# Patient Record
Sex: Female | Born: 1990 | Race: White | Hispanic: No | Marital: Married | Smoking: Never smoker
Health system: Southern US, Community
[De-identification: ages and names within clinical notes are randomized; demographics above are authoritative.]

## PROBLEM LIST (undated history)

## (undated) ENCOUNTER — Inpatient Hospital Stay (HOSPITAL_COMMUNITY): Payer: Self-pay

## (undated) DIAGNOSIS — O26899 Other specified pregnancy related conditions, unspecified trimester: Secondary | ICD-10-CM

## (undated) DIAGNOSIS — N760 Acute vaginitis: Secondary | ICD-10-CM

## (undated) DIAGNOSIS — N751 Abscess of Bartholin's gland: Secondary | ICD-10-CM

## (undated) DIAGNOSIS — R87619 Unspecified abnormal cytological findings in specimens from cervix uteri: Secondary | ICD-10-CM

## (undated) DIAGNOSIS — IMO0002 Reserved for concepts with insufficient information to code with codable children: Secondary | ICD-10-CM

## (undated) DIAGNOSIS — Z6791 Unspecified blood type, Rh negative: Secondary | ICD-10-CM

## (undated) DIAGNOSIS — B9689 Other specified bacterial agents as the cause of diseases classified elsewhere: Secondary | ICD-10-CM

## (undated) DIAGNOSIS — N390 Urinary tract infection, site not specified: Secondary | ICD-10-CM

## (undated) DIAGNOSIS — F419 Anxiety disorder, unspecified: Secondary | ICD-10-CM

## (undated) DIAGNOSIS — E349 Endocrine disorder, unspecified: Secondary | ICD-10-CM

## (undated) HISTORY — PX: WISDOM TOOTH EXTRACTION: SHX21

## (undated) HISTORY — DX: Urinary tract infection, site not specified: N39.0

## (undated) HISTORY — DX: Reserved for concepts with insufficient information to code with codable children: IMO0002

## (undated) HISTORY — DX: Other specified bacterial agents as the cause of diseases classified elsewhere: B96.89

## (undated) HISTORY — DX: Abscess of Bartholin's gland: N75.1

## (undated) HISTORY — DX: Acute vaginitis: B96.89

## (undated) HISTORY — DX: Unspecified blood type, rh negative: Z67.91

## (undated) HISTORY — DX: Endocrine disorder, unspecified: E34.9

## (undated) HISTORY — DX: Unspecified abnormal cytological findings in specimens from cervix uteri: R87.619

## (undated) HISTORY — DX: Anxiety disorder, unspecified: F41.9

## (undated) HISTORY — DX: Other specified pregnancy related conditions, unspecified trimester: O26.899

## (undated) HISTORY — PX: MOUTH SURGERY: SHX715

## (undated) HISTORY — DX: Other specified bacterial agents as the cause of diseases classified elsewhere: N76.0

---
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
## 2011-06-24 LAB — BASIC METABOLIC PANEL
CO2: 23
Calcium: 9.2
Chloride: 107
Creatinine, Ser: 0.79
GFR calc Af Amer: >60
Glucose, Bld: 114 — ABNORMAL HIGH
Sodium: 138

## 2011-06-24 LAB — URINALYSIS, ROUTINE W REFLEX MICROSCOPIC
Glucose, UA: NEGATIVE
Leukocytes, UA: NEGATIVE
Nitrite: NEGATIVE
pH: 5.5

## 2011-06-24 LAB — CBC
Hemoglobin: 14.5
MCHC: 33.7
MCV: 90.4
RBC: 4.76
RDW: 12.5

## 2011-06-24 LAB — URINE MICROSCOPIC-ADD ON

## 2011-06-24 LAB — DIFFERENTIAL
Basophils Absolute: 0
Basophils Relative: 0
Eosinophils Absolute: 0
Monocytes Absolute: 0.3
Monocytes Relative: 3
Neutro Abs: 9.6 — ABNORMAL HIGH

## 2011-09-18 ENCOUNTER — Ambulatory Visit (INDEPENDENT_AMBULATORY_CARE_PROVIDER_SITE_OTHER): Payer: BC Managed Care – PPO

## 2011-09-18 DIAGNOSIS — N390 Urinary tract infection, site not specified: Secondary | ICD-10-CM

## 2011-09-23 NOTE — L&D Delivery Note (Signed)
Delivery Note At 7:03 PM a viable female was delivered via Vaginal, Spontaneous Delivery (Presentation: ; Occiput Anterior).  APGAR: 9, 10; weight .   Placenta status:  Spontaneous.  Cord: 3 vessels with the following complications: None.  Cord pH: not done  Anesthesia: Epidural  Episiotomy: None Lacerations: 1st degree;Labial;Perineal Suture Repair: 3.0 vicryl Est. Blood Loss (mL)  Mom to postpartum.  Baby to nursery-stable.  Quinesha Selinger, CNM. 05/26/2012, 7:36 PM

## 2011-11-17 ENCOUNTER — Ambulatory Visit (INDEPENDENT_AMBULATORY_CARE_PROVIDER_SITE_OTHER): Payer: BC Managed Care – PPO | Admitting: Family Medicine

## 2011-11-17 VITALS — BP 122/86 | HR 123 | Temp 97.7°F | Resp 16 | Ht 66.0 in | Wt 181.8 lb

## 2011-11-17 DIAGNOSIS — N390 Urinary tract infection, site not specified: Secondary | ICD-10-CM

## 2011-11-17 DIAGNOSIS — R300 Dysuria: Secondary | ICD-10-CM

## 2011-11-17 DIAGNOSIS — N898 Other specified noninflammatory disorders of vagina: Secondary | ICD-10-CM

## 2011-11-17 LAB — POCT UA - MICROSCOPIC ONLY
Casts, Ur, LPF, POC: NEGATIVE
Crystals, Ur, HPF, POC: NEGATIVE
Yeast, UA: NEGATIVE

## 2011-11-17 LAB — POCT URINALYSIS DIPSTICK
Glucose, UA: NEGATIVE
Nitrite, UA: POSITIVE
Protein, UA: 100
Spec Grav, UA: 1.02
Urobilinogen, UA: 2
pH, UA: 7

## 2011-11-17 LAB — POCT WET PREP WITH KOH
KOH Prep POC: NEGATIVE
Trichomonas, UA: NEGATIVE

## 2011-11-17 MED ORDER — METRONIDAZOLE 500 MG PO TABS: 500.0000 mg | ORAL_TABLET | Freq: Two times a day (BID) | ORAL | Status: AC

## 2011-11-17 MED ORDER — NITROFURANTOIN MONOHYD MACRO 100 MG PO CAPS: 100.0000 mg | ORAL_CAPSULE | Freq: Two times a day (BID) | ORAL | Status: AC

## 2011-11-17 NOTE — Progress Notes (Signed)
Patient Name: Brandy Patterson Date of Birth: 09/27/1990 Medical Record Number: [id number] Gender: female Date of Encounter: 11/17/2011  History of Present Illness:  Brandy Patterson is a 21 y.o. very pleasant female patient who presents with the following:  Currently 3 months pregnant.  Awoke with UTI symptoms last night around 1am.  Took azo after she looked it up online to make sure it was safe for pregnancy.  Notes dysuria and frequency.  No fever, nausea (pregnancy associated) is getting better.  EDC is 06/07/12: this would make her 11 weeks today.  Did have a dating ultrasound. She has had #2 miscarriages.  Is applying for emergency medicaid because her insurance does not cover pregnancy.  So far she has had limited OB care but does plan to start more regular care.  Is on PNV Also notes a rash on her chest and belly for the last couple of weeks- is itchy.   Also notes some clear/ white vaginal discharge which has been present since she became pregnant.  No itching.    There is no problem list on file for this patient.  Past Medical History  Diagnosis Date  . UTI (lower urinary tract infection)   . Bacterial vaginosis    Past Surgical History  Procedure Date  . Hydradenitis excision 2010    groin  . Mouth surgery    History  Substance Use Topics  . Smoking status: Never Smoker   . Smokeless tobacco: Never Used  . Alcohol Use: No   Family History  Problem Relation Age of Onset  . Diabetes Father   . Hypertension Father    Allergies  Allergen Reactions  . Doxycycline Hives  . Erythromycin Hives  . Keflex Hives    Medication list has been reviewed and updated.  Review of Systems: As per HPI. Otherwise negative  Physical Examination: Filed Vitals:   11/17/11 0827  BP: 122/86  Pulse: 123  Temp: 97.7 F (36.5 C)  TempSrc: Oral  Resp: 16  Height: 5\' 6"  (1.676 m)  Weight: 181 lb 12.8 oz (82.464 kg)  recheck pulse by me: 90 BPM  Body mass index is 29.34  kg/(m^2).  GEN: WDWN, NAD, Non-toxic, A & O x 3, overweight HEENT: Atraumatic, Normocephalic. Neck supple. No masses, No LAD. Ears and Nose: No external deformity. CV: RRR, No M/G/R. No JVD. No thrill. No extra heart sounds. PULM: CTA B, no wheezes, crackles, rhonchi. No retractions. No resp. distress. No accessory muscle use. ABD: S, NT, ND, +BS. No rebound. No HSM. GU: normal external genitalia, evidence of hydradenitis excision in the past.  No cervical motion tenderness or cervical discharge.  Uterus slighly enlarged (consistent with dates), adnexa wnl.   Skin: discrete excoriated macules over her trunk and breasts EXTR: No c/c/e NEURO Normal gait.  PSYCH: Normally interactive. Conversant. Not depressed or anxious appearing.  Calm demeanor.   Results for orders placed in visit on 11/17/11  POCT URINALYSIS DIPSTICK      Component Value Range   Color, UA orange     Clarity, UA cloudy     Glucose, UA negative     Bilirubin, UA small     Ketones, UA trace     Spec Grav, UA 1.020     Blood, UA trace     pH, UA 7.0     Protein, UA 100     Urobilinogen, UA 2.0     Nitrite, UA positive     Leukocytes, UA large (3+)  POCT UA - MICROSCOPIC ONLY      Component Value Range   WBC, Ur, HPF, POC tntc     RBC, urine, microscopic 2-4     Bacteria, U Microscopic small     Mucus, UA small     Epithelial cells, urine per micros 2-4     Crystals, Ur, HPF, POC negative     Casts, Ur, LPF, POC negative     Yeast, UA negative    POCT WET PREP WITH KOH      Component Value Range   Trichomonas, UA Negative     Clue Cells Wet Prep HPF POC 2-4     Epithelial Wet Prep HPF POC 1-3     Yeast Wet Prep HPF POC negative     Bacteria Wet Prep HPF POC 1+     RBC Wet Prep HPF POC negative     WBC Wet Prep HPF POC 5-8     KOH Prep POC Negative     Assessment and Plan: 1. Dysuria  POCT urinalysis dipstick, POCT UA - Microscopic Only, Urine culture  2. Vaginal Discharge  POCT Wet Prep with KOH  3.  UTI (lower urinary tract infection)     Treat for BV with flagyl 500 BID for 7 days, and UTI with macrobid.  Urine culture pending.  Asked her to come back in about a week to have a urine recheck, sooner if her symptoms are not better.

## 2011-11-19 LAB — URINE CULTURE: Colony Count: >=100000

## 2011-11-19 MED ORDER — SULFAMETHOXAZOLE-TRIMETHOPRIM 800-160 MG PO TABS: 1.0000 | ORAL_TABLET | Freq: Two times a day (BID) | ORAL | Status: AC

## 2011-11-19 MED ORDER — AMOXICILLIN 500 MG PO CAPS: 500.0000 mg | ORAL_CAPSULE | Freq: Two times a day (BID) | ORAL | Status: AC

## 2011-11-19 NOTE — Progress Notes (Signed)
Addended by: Abbe Amsterdam C on: 11/19/2011 06:03 PM   Modules accepted: Orders

## 2011-11-19 NOTE — Progress Notes (Signed)
Addended by: Abbe Amsterdam C on: 11/19/2011 02:21 PM   Modules accepted: Orders

## 2011-12-08 ENCOUNTER — Encounter (INDEPENDENT_AMBULATORY_CARE_PROVIDER_SITE_OTHER): Payer: BC Managed Care – PPO

## 2011-12-08 DIAGNOSIS — Z331 Pregnant state, incidental: Secondary | ICD-10-CM

## 2011-12-08 LAB — OB RESULTS CONSOLE HEPATITIS B SURFACE ANTIGEN: Hepatitis B Surface Ag: NEGATIVE

## 2011-12-08 LAB — OB RESULTS CONSOLE HGB/HCT, BLOOD: Hemoglobin: 12.9 g/dL

## 2011-12-08 LAB — OB RESULTS CONSOLE ABO/RH: RH Type: NEGATIVE

## 2011-12-10 ENCOUNTER — Encounter (INDEPENDENT_AMBULATORY_CARE_PROVIDER_SITE_OTHER): Payer: BC Managed Care – PPO | Admitting: Registered Nurse

## 2011-12-10 DIAGNOSIS — Z3480 Encounter for supervision of other normal pregnancy, unspecified trimester: Secondary | ICD-10-CM

## 2012-01-07 ENCOUNTER — Other Ambulatory Visit: Payer: BC Managed Care – PPO

## 2012-01-07 ENCOUNTER — Ambulatory Visit (INDEPENDENT_AMBULATORY_CARE_PROVIDER_SITE_OTHER): Payer: BC Managed Care – PPO | Admitting: Obstetrics and Gynecology

## 2012-01-07 ENCOUNTER — Ambulatory Visit: Payer: BC Managed Care – PPO

## 2012-01-07 ENCOUNTER — Other Ambulatory Visit: Payer: Self-pay | Admitting: Obstetrics and Gynecology

## 2012-01-07 ENCOUNTER — Encounter: Payer: Self-pay | Admitting: Obstetrics and Gynecology

## 2012-01-07 VITALS — BP 104/60 | Ht 65.0 in | Wt 199.0 lb

## 2012-01-07 DIAGNOSIS — Z1389 Encounter for screening for other disorder: Secondary | ICD-10-CM

## 2012-01-07 DIAGNOSIS — Z3689 Encounter for other specified antenatal screening: Secondary | ICD-10-CM

## 2012-01-07 DIAGNOSIS — Z331 Pregnant state, incidental: Secondary | ICD-10-CM

## 2012-01-07 LAB — US OB COMP + 14 WK

## 2012-01-07 NOTE — Progress Notes (Signed)
Doing well,  Anat [place] today S=D 54% Cx=3.16cm Anterior placenta grade 0 Normal fluid No anomalies seen Incomplete views of: profile, NB, lower spine and AA  Will repeat scan in 4wks  rv'd PTL sx's Discussed CBE Pt reports less anxiety now than beginning of preg

## 2012-02-03 ENCOUNTER — Encounter: Payer: Self-pay | Admitting: Obstetrics and Gynecology

## 2012-02-04 ENCOUNTER — Ambulatory Visit: Payer: BC Managed Care – PPO

## 2012-02-04 ENCOUNTER — Encounter: Payer: Self-pay | Admitting: Obstetrics and Gynecology

## 2012-02-04 ENCOUNTER — Ambulatory Visit (INDEPENDENT_AMBULATORY_CARE_PROVIDER_SITE_OTHER): Payer: BC Managed Care – PPO | Admitting: Obstetrics and Gynecology

## 2012-02-04 VITALS — BP 100/60 | Wt 208.0 lb

## 2012-02-04 DIAGNOSIS — O358XX0 Maternal care for other (suspected) fetal abnormality and damage, not applicable or unspecified: Secondary | ICD-10-CM

## 2012-02-04 DIAGNOSIS — Z331 Pregnant state, incidental: Secondary | ICD-10-CM

## 2012-02-04 LAB — US OB FOLLOW UP

## 2012-02-04 NOTE — Progress Notes (Signed)
Doing well. Here for f/u US for anatomy--all structures visualized. Normal fluid and growth.  Cervix 3.79.  Plan glucola, Hgb, RPR NV. Rhophylac at 28-30 weeks--reviewed information on Rhophylac rationale.

## 2012-03-02 ENCOUNTER — Telehealth: Payer: Self-pay | Admitting: Obstetrics and Gynecology

## 2012-03-02 NOTE — Telephone Encounter (Signed)
Triage/epic 

## 2012-03-03 ENCOUNTER — Other Ambulatory Visit: Payer: Medicaid Other

## 2012-03-03 ENCOUNTER — Ambulatory Visit (INDEPENDENT_AMBULATORY_CARE_PROVIDER_SITE_OTHER): Payer: BC Managed Care – PPO | Admitting: Obstetrics and Gynecology

## 2012-03-03 VITALS — BP 104/64 | Wt 216.0 lb

## 2012-03-03 DIAGNOSIS — Z331 Pregnant state, incidental: Secondary | ICD-10-CM

## 2012-03-03 DIAGNOSIS — Z3490 Encounter for supervision of normal pregnancy, unspecified, unspecified trimester: Secondary | ICD-10-CM

## 2012-03-03 LAB — CBC
MCH: 31 pg (ref 26.0–34.0)
MCV: 92 fL (ref 78.0–100.0)
Platelets: 162 10*3/uL (ref 150–400)
RDW: 13 % (ref 11.5–15.5)
WBC: 9.6 10*3/uL (ref 4.0–10.5)

## 2012-03-03 NOTE — Telephone Encounter (Signed)
PT CALLED, STATES HAS AN APPT FOR 1 HR GLUCOLA TODAY, ?'S IF SHE NEEDS TO FAST.  PT ADVISED TO EAT SOMETHING WITH PROTEIN BEFORE HAVING TEST DONE.  EXPLAINED TO PT THE SIGNIFICANCE OF EATING BEFORE THIS TYPE OF TEST, PT VOICES UNDERSTANDING, WILL EAT PRIOR TO TEST.

## 2012-03-03 NOTE — Progress Notes (Signed)
Glucola today Discussed wt Rhogam at 28 wks Doing well otherwise RTO 2 wks

## 2012-03-04 LAB — GLUCOSE TOLERANCE, 1 HOUR: Glucose, 1 Hour GTT: 103 mg/dL (ref 70–140)

## 2012-03-17 ENCOUNTER — Ambulatory Visit (INDEPENDENT_AMBULATORY_CARE_PROVIDER_SITE_OTHER): Payer: Medicaid Other | Admitting: Obstetrics and Gynecology

## 2012-03-17 ENCOUNTER — Encounter: Payer: Self-pay | Admitting: Obstetrics and Gynecology

## 2012-03-17 ENCOUNTER — Inpatient Hospital Stay (HOSPITAL_COMMUNITY)
Admission: AD | Admit: 2012-03-17 | Discharge: 2012-03-17 | Disposition: A | Discharge status: Home / Self Care | Payer: Medicaid Other | Source: Ambulatory Visit | Attending: Obstetrics and Gynecology | Admitting: Obstetrics and Gynecology

## 2012-03-17 ENCOUNTER — Encounter: Payer: Medicaid Other | Admitting: Obstetrics and Gynecology

## 2012-03-17 VITALS — BP 118/68 | Wt 222.0 lb

## 2012-03-17 DIAGNOSIS — F419 Anxiety disorder, unspecified: Secondary | ICD-10-CM

## 2012-03-17 DIAGNOSIS — Z9149 Other personal history of psychological trauma, not elsewhere classified: Secondary | ICD-10-CM

## 2012-03-17 DIAGNOSIS — Z2989 Encounter for other specified prophylactic measures: Secondary | ICD-10-CM | POA: Insufficient documentation

## 2012-03-17 DIAGNOSIS — F411 Generalized anxiety disorder: Secondary | ICD-10-CM

## 2012-03-17 DIAGNOSIS — Z331 Pregnant state, incidental: Secondary | ICD-10-CM

## 2012-03-17 DIAGNOSIS — Z298 Encounter for other specified prophylactic measures: Secondary | ICD-10-CM | POA: Insufficient documentation

## 2012-03-17 DIAGNOSIS — Z6791 Unspecified blood type, Rh negative: Secondary | ICD-10-CM | POA: Insufficient documentation

## 2012-03-17 DIAGNOSIS — O360990 Maternal care for other rhesus isoimmunization, unspecified trimester, not applicable or unspecified: Secondary | ICD-10-CM

## 2012-03-17 DIAGNOSIS — IMO0002 Reserved for concepts with insufficient information to code with codable children: Secondary | ICD-10-CM

## 2012-03-17 LAB — ABO/RH: ABO/RH(D): A NEG

## 2012-03-17 MED ORDER — RHO D IMMUNE GLOBULIN 1500 UNIT/2ML IJ SOLN
300.0000 ug | Freq: Once | INTRAMUSCULAR | Status: AC
  Administered 2012-03-17: 300 ug via INTRAMUSCULAR
  Filled 2012-03-17: qty 2

## 2012-03-17 NOTE — Progress Notes (Signed)
[redacted]w[redacted]d Going to get rhogam today rec CBE,  And choosing peds, took BF  rvd 1hr gtt =103 rv'd PTL and FKC

## 2012-03-17 NOTE — Patient Instructions (Signed)
Preventing Preterm Labor Preterm labor is when a pregnant woman has contractions that cause the cervix to open, shorten, and thin before 37 weeks of pregnancy. You will have regular contractions (tightening) 2 to 3 minutes apart. This usually causes discomfort or pain. HOME CARE  Eat a healthy diet.   Take your vitamins as told by your doctor.   Drink enough fluids to keep your pee (urine) clear or pale yellow every day.   Get rest and sleep.   Do not have sex if you are at high risk for preterm labor.   Follow your doctor's advice about activity, medicines, and tests.   Avoid stress.   Avoid hard labor or exercise that lasts for a long time.   Do not smoke.  GET HELP RIGHT AWAY IF:   You are having contractions.   You have belly (abdominal) pain.   You have bleeding from your vagina.   You have pain when you pee (urinate).   You have abnormal discharge from your vagina.   You have a temperature by mouth above 102 F (38.9 C).  MAKE SURE YOU:  Understand these instructions.   Will watch your condition.   Will get help if you are not doing well or get worse.  Document Released: 12/05/2008 Document Revised: 08/28/2011 Document Reviewed: 12/05/2008 [employer] Patient Information 2012 [employer].Fetal Movement Counts Patient Name: __________________________________________________ Patient Due Date: ____________________ Kick counts is highly recommended in high risk pregnancies, but it is a good idea for every pregnant woman to do. Start counting fetal movements at 28 weeks of the pregnancy. Fetal movements increase after eating a full meal or eating or drinking something sweet (the blood sugar is higher). It is also important to drink plenty of fluids (well hydrated) before doing the count. Lie on your left side because it helps with the circulation or you can sit in a comfortable chair with your arms over your belly (abdomen) with no distractions around you. DOING THE  COUNT  Try to do the count the same time of day each time you do it.   Mark the day and time, then see how long it takes for you to feel 10 movements (kicks, flutters, swishes, rolls). You should have at least 10 movements within 2 hours. You will most likely feel 10 movements in much less than 2 hours. If you do not, wait an hour and count again. After a couple of days you will see a pattern.   What you are looking for is a change in the pattern or not enough counts in 2 hours. Is it taking longer in time to reach 10 movements?  SEEK MEDICAL CARE IF:  You feel less than 10 counts in 2 hours. Tried twice.   No movement in one hour.   The pattern is changing or taking longer each day to reach 10 counts in 2 hours.   You feel the baby is not moving as it usually does.  Date: ____________ Movements: ____________ Start time: ____________ Finish time: ____________  Date: ____________ Movements: ____________ Start time: ____________ Finish time: ____________ Date: ____________ Movements: ____________ Start time: ____________ Finish time: ____________ Date: ____________ Movements: ____________ Start time: ____________ Finish time: ____________ Date: ____________ Movements: ____________ Start time: ____________ Finish time: ____________ Date: ____________ Movements: ____________ Start time: ____________ Finish time: ____________ Date: ____________ Movements: ____________ Start time: ____________ Finish time: ____________ Date: ____________ Movements: ____________ Start time: ____________ Finish time: ____________  Date: ____________ Movements: ____________ Start time: ____________ Finish time:   ____________ Date: ____________ Movements: ____________ Start time: ____________ Finish time: ____________ Date: ____________ Movements: ____________ Start time: ____________ Finish time: ____________ Date: ____________ Movements: ____________ Start time: ____________ Finish time: ____________ Date:  ____________ Movements: ____________ Start time: ____________ Finish time: ____________ Date: ____________ Movements: ____________ Start time: ____________ Finish time: ____________ Date: ____________ Movements: ____________ Start time: ____________ Finish time: ____________  Date: ____________ Movements: ____________ Start time: ____________ Finish time: ____________ Date: ____________ Movements: ____________ Start time: ____________ Finish time: ____________ Date: ____________ Movements: ____________ Start time: ____________ Finish time: ____________ Date: ____________ Movements: ____________ Start time: ____________ Finish time: ____________ Date: ____________ Movements: ____________ Start time: ____________ Finish time: ____________ Date: ____________ Movements: ____________ Start time: ____________ Finish time: ____________ Date: ____________ Movements: ____________ Start time: ____________ Finish time: ____________  Date: ____________ Movements: ____________ Start time: ____________ Finish time: ____________ Date: ____________ Movements: ____________ Start time: ____________ Finish time: ____________ Date: ____________ Movements: ____________ Start time: ____________ Finish time: ____________ Date: ____________ Movements: ____________ Start time: ____________ Finish time: ____________ Date: ____________ Movements: ____________ Start time: ____________ Finish time: ____________ Date: ____________ Movements: ____________ Start time: ____________ Finish time: ____________ Date: ____________ Movements: ____________ Start time: ____________ Finish time: ____________  Date: ____________ Movements: ____________ Start time: ____________ Finish time: ____________ Date: ____________ Movements: ____________ Start time: ____________ Finish time: ____________ Date: ____________ Movements: ____________ Start time: ____________ Finish time: ____________ Date: ____________ Movements: ____________ Start  time: ____________ Finish time: ____________ Date: ____________ Movements: ____________ Start time: ____________ Finish time: ____________ Date: ____________ Movements: ____________ Start time: ____________ Finish time: ____________ Date: ____________ Movements: ____________ Start time: ____________ Finish time: ____________  Date: ____________ Movements: ____________ Start time: ____________ Finish time: ____________ Date: ____________ Movements: ____________ Start time: ____________ Finish time: ____________ Date: ____________ Movements: ____________ Start time: ____________ Finish time: ____________ Date: ____________ Movements: ____________ Start time: ____________ Finish time: ____________ Date: ____________ Movements: ____________ Start time: ____________ Finish time: ____________ Date: ____________ Movements: ____________ Start time: ____________ Finish time: ____________ Date: ____________ Movements: ____________ Start time: ____________ Finish time: ____________  Date: ____________ Movements: ____________ Start time: ____________ Finish time: ____________ Date: ____________ Movements: ____________ Start time: ____________ Finish time: ____________ Date: ____________ Movements: ____________ Start time: ____________ Finish time: ____________ Date: ____________ Movements: ____________ Start time: ____________ Finish time: ____________ Date: ____________ Movements: ____________ Start time: ____________ Finish time: ____________ Date: ____________ Movements: ____________ Start time: ____________ Finish time: ____________ Date: ____________ Movements: ____________ Start time: ____________ Finish time: ____________  Date: ____________ Movements: ____________ Start time: ____________ Finish time: ____________ Date: ____________ Movements: ____________ Start time: ____________ Finish time: ____________ Date: ____________ Movements: ____________ Start time: ____________ Finish time:  ____________ Date: ____________ Movements: ____________ Start time: ____________ Finish time: ____________ Date: ____________ Movements: ____________ Start time: ____________ Finish time: ____________ Date: ____________ Movements: ____________ Start time: ____________ Finish time: ____________ Document Released: 10/08/2006 Document Revised: 08/28/2011 Document Reviewed: 04/10/2009 [employer] Patient Information 2012 [employer]. 

## 2012-03-17 NOTE — Progress Notes (Signed)
No complaints

## 2012-03-17 NOTE — MAU Note (Addendum)
Received rhophyllac previously, no problems.  Time associated with blood draw and injection explained. Hand out given.

## 2012-03-18 LAB — RH IG WORKUP (INCLUDES ABO/RH)
Gestational Age(Wks): 28
Unit division: 0

## 2012-04-01 ENCOUNTER — Encounter: Payer: Self-pay | Admitting: Obstetrics and Gynecology

## 2012-04-01 ENCOUNTER — Ambulatory Visit (INDEPENDENT_AMBULATORY_CARE_PROVIDER_SITE_OTHER): Payer: Medicaid Other | Admitting: Obstetrics and Gynecology

## 2012-04-01 VITALS — BP 100/68 | Wt 222.0 lb

## 2012-04-01 DIAGNOSIS — Z331 Pregnant state, incidental: Secondary | ICD-10-CM

## 2012-04-01 NOTE — Progress Notes (Signed)
Patient ID: ABBIE BERLING, female   DOB: 09-Jul-1991, 21 y.o.   MRN: [id number] [redacted]w[redacted]d had rhophlac, 1 gtt wnl Reviewed s/s preterm labor, srom, vag bleeding,daily kick counts to report, encouraged 8 water daily and frequent voids. Lavera Guise, CNM

## 2012-04-01 NOTE — Progress Notes (Signed)
Received rhogam shot on 03/17/12

## 2012-04-14 ENCOUNTER — Telehealth: Payer: Self-pay

## 2012-04-14 MED ORDER — CIPROFLOXACIN HCL 500 MG PO TABS: 500.0000 mg | ORAL_TABLET | Freq: Two times a day (BID) | ORAL | Status: DC

## 2012-04-14 NOTE — Telephone Encounter (Signed)
NCQA SCREEN SHOT

## 2012-04-15 ENCOUNTER — Encounter: Payer: Self-pay | Admitting: Obstetrics and Gynecology

## 2012-04-15 ENCOUNTER — Ambulatory Visit (INDEPENDENT_AMBULATORY_CARE_PROVIDER_SITE_OTHER): Payer: Medicaid Other | Admitting: Obstetrics and Gynecology

## 2012-04-15 ENCOUNTER — Encounter: Payer: Medicaid Other | Admitting: Obstetrics and Gynecology

## 2012-04-15 VITALS — BP 112/64 | Wt 229.0 lb

## 2012-04-15 DIAGNOSIS — N949 Unspecified condition associated with female genital organs and menstrual cycle: Secondary | ICD-10-CM

## 2012-04-15 DIAGNOSIS — Z331 Pregnant state, incidental: Secondary | ICD-10-CM

## 2012-04-15 DIAGNOSIS — N898 Other specified noninflammatory disorders of vagina: Secondary | ICD-10-CM

## 2012-04-15 LAB — POCT WET PREP (WET MOUNT)
KOH Wet Prep POC: NEGATIVE
Trichomonas Wet Prep HPF POC: NEGATIVE
pH: 4.5

## 2012-04-15 MED ORDER — METRONIDAZOLE 500 MG PO TABS: 500.0000 mg | ORAL_TABLET | Freq: Two times a day (BID) | ORAL | Status: AC

## 2012-04-15 NOTE — Progress Notes (Signed)
C/o yellow d/c. C/o a lot of swelling in feet and ankle

## 2012-04-15 NOTE — Progress Notes (Signed)
Complains of vaginal odor. Wet prep: negative Patient wants metronidazole 500 mg twice a day for 7 days because of the long history of vaginosis. GC and Chlamydia sent. Return to office in 2 weeks. Dr. Stefano Gaul

## 2012-04-16 LAB — GC/CHLAMYDIA PROBE AMP, GENITAL
Chlamydia, DNA Probe: NEGATIVE
GC Probe Amp, Genital: NEGATIVE

## 2012-04-29 ENCOUNTER — Ambulatory Visit (INDEPENDENT_AMBULATORY_CARE_PROVIDER_SITE_OTHER): Payer: BC Managed Care – PPO | Admitting: Obstetrics and Gynecology

## 2012-04-29 ENCOUNTER — Encounter: Payer: Self-pay | Admitting: Obstetrics and Gynecology

## 2012-04-29 VITALS — BP 110/60 | Wt 230.0 lb

## 2012-04-29 DIAGNOSIS — O26849 Uterine size-date discrepancy, unspecified trimester: Secondary | ICD-10-CM | POA: Insufficient documentation

## 2012-04-29 DIAGNOSIS — O4190X0 Disorder of amniotic fluid and membranes, unspecified, unspecified trimester, not applicable or unspecified: Secondary | ICD-10-CM

## 2012-04-29 DIAGNOSIS — O288 Other abnormal findings on antenatal screening of mother: Secondary | ICD-10-CM

## 2012-04-29 NOTE — Progress Notes (Signed)
No concerns. 

## 2012-04-29 NOTE — Progress Notes (Signed)
Doing OK, but feeling the discomforts of 3rd trimester. GC, chlamydia negative from LV. S>D--plan [place] at NV. GBS NV.

## 2012-04-29 NOTE — Addendum Note (Signed)
Addended by: Janeece Agee on: 04/29/2012 01:14 PM   Modules accepted: Orders

## 2012-05-04 ENCOUNTER — Encounter (HOSPITAL_COMMUNITY): Payer: Self-pay | Admitting: *Deleted

## 2012-05-04 ENCOUNTER — Inpatient Hospital Stay (HOSPITAL_COMMUNITY)
Admission: AD | Admit: 2012-05-04 | Discharge: 2012-05-04 | Disposition: A | Discharge status: Home / Self Care | Payer: BC Managed Care – PPO | Source: Ambulatory Visit | Attending: Obstetrics and Gynecology | Admitting: Obstetrics and Gynecology

## 2012-05-04 DIAGNOSIS — O4700 False labor before 37 completed weeks of gestation, unspecified trimester: Secondary | ICD-10-CM

## 2012-05-04 DIAGNOSIS — Z331 Pregnant state, incidental: Secondary | ICD-10-CM

## 2012-05-04 DIAGNOSIS — O99891 Other specified diseases and conditions complicating pregnancy: Secondary | ICD-10-CM | POA: Insufficient documentation

## 2012-05-04 DIAGNOSIS — Z6791 Unspecified blood type, Rh negative: Secondary | ICD-10-CM

## 2012-05-04 DIAGNOSIS — O26849 Uterine size-date discrepancy, unspecified trimester: Secondary | ICD-10-CM

## 2012-05-04 DIAGNOSIS — IMO0002 Reserved for concepts with insufficient information to code with codable children: Secondary | ICD-10-CM

## 2012-05-04 DIAGNOSIS — F419 Anxiety disorder, unspecified: Secondary | ICD-10-CM

## 2012-05-04 DIAGNOSIS — N949 Unspecified condition associated with female genital organs and menstrual cycle: Secondary | ICD-10-CM | POA: Insufficient documentation

## 2012-05-04 NOTE — MAU Note (Signed)
Patient states that for the past 2 days has been wearing a panty liner that gets wet. States there is a sweet smell and is concerned about leaking fluid. Denies any pain and reports fetal movement, but not as much as usual.

## 2012-05-04 NOTE — MAU Note (Signed)
Pt states she first felt leaking of fluid yesterday morning. Pt states she has been leaking all day

## 2012-05-05 NOTE — MAU Provider Note (Signed)
  History     CSN: [id number]  Arrival date and time: 05/04/12 1903   None     Chief Complaint  Patient presents with  . Vaginal Discharge   HPI Comments: Pt is a G3P0 at [redacted]w[redacted]d arrives unannounced w c/o ? LOF, states she just has felt more d/c the last couple of days and her family was worried and insisted she come for eval. She denies gush of fluid, has used a panty liner, denies any VB, no ctx, reports GFM   Vaginal Discharge The patient's primary symptoms include a vaginal discharge.      Past Medical History  Diagnosis Date  . UTI (lower urinary tract infection)   . Bacterial vaginosis   . Rh negative status during pregnancy   . Anxiety   . Abnormal Pap smear   . Endocrinopathy     Past Surgical History  Procedure Date  . Hydradenitis excision 2010    groin  . Mouth surgery   . Wisdom tooth extraction     Family History  Problem Relation Age of Onset  . Diabetes Father   . Hypertension Father   . Hypertension Maternal Grandmother   . Diabetes Maternal Grandmother   . Hypertension Maternal Grandfather   . Diabetes Maternal Grandfather   . Cancer Maternal Grandfather   . Anemia Neg Hx   . Migraines Mother   . Migraines Sister     History  Substance Use Topics  . Smoking status: Never Smoker   . Smokeless tobacco: Never Used  . Alcohol Use: No    Allergies:  Allergies  Allergen Reactions  . Cephalexin Hives  . Doxycycline Hives  . Erythromycin Hives    No prescriptions prior to admission    Review of Systems  Genitourinary: Positive for vaginal discharge.  All other systems reviewed and are negative.   Physical Exam   Blood pressure 113/77, pulse 105, temperature 97.6 F (36.4 C), temperature source Oral, resp. rate 18, height 5' 4.5" (1.638 m), weight 233 lb 3.2 oz (105.779 kg), last menstrual period 08/30/2011, SpO2 100.00%.  Physical Exam  Nursing note and vitals reviewed. Constitutional: She is oriented to person, place, and  time. She appears well-developed and well-nourished.  HENT:  Head: Normocephalic.  Neck: Normal range of motion.  Cardiovascular: Normal rate.   Respiratory: Effort normal.  GI: Soft.  Genitourinary: Vagina normal.  Musculoskeletal: Normal range of motion. She exhibits edema.  Neurological: She is alert and oriented to person, place, and time.  Skin: Skin is warm and dry.  Psychiatric: She has a normal mood and affect. Her behavior is normal.    MAU Course  Procedures    Assessment and Plan  IUP at [redacted]w[redacted]d FHR cat 1 toco quiet amnisure neg VE deferred  dc'd home w PTL sx's and PROM/vs. Norm d/c rv'd, FKC Pt has f/u office Thurs   Laruen Risser M 05/05/2012, 3:49 AM

## 2012-05-10 ENCOUNTER — Ambulatory Visit (INDEPENDENT_AMBULATORY_CARE_PROVIDER_SITE_OTHER): Payer: BC Managed Care – PPO

## 2012-05-10 ENCOUNTER — Encounter: Payer: Self-pay | Admitting: Obstetrics and Gynecology

## 2012-05-10 ENCOUNTER — Ambulatory Visit (INDEPENDENT_AMBULATORY_CARE_PROVIDER_SITE_OTHER): Payer: BC Managed Care – PPO | Admitting: Obstetrics and Gynecology

## 2012-05-10 VITALS — BP 98/70 | Wt 235.0 lb

## 2012-05-10 DIAGNOSIS — O26849 Uterine size-date discrepancy, unspecified trimester: Secondary | ICD-10-CM

## 2012-05-10 DIAGNOSIS — Z331 Pregnant state, incidental: Secondary | ICD-10-CM

## 2012-05-10 DIAGNOSIS — O288 Other abnormal findings on antenatal screening of mother: Secondary | ICD-10-CM

## 2012-05-10 DIAGNOSIS — O4190X0 Disorder of amniotic fluid and membranes, unspecified, unspecified trimester, not applicable or unspecified: Secondary | ICD-10-CM

## 2012-05-10 LAB — US OB FOLLOW UP

## 2012-05-10 NOTE — Progress Notes (Signed)
Doing well, but feels baby is very big. [place] today:  EFW 7+1, 88%ile. AFI 13.9. Vtx Reviewed issue of possible LGA, with cautions on margin of error on [place] and risk of C/S when inducing without favorable cervix. Will repeat US in 2 weeks for growth and fluid due to LGA. Cultures and GBS today. Cervix 1 cm, 70%, vtx, -2.

## 2012-05-11 LAB — GC/CHLAMYDIA PROBE AMP, GENITAL: GC Probe Amp, Genital: NEGATIVE

## 2012-05-12 ENCOUNTER — Encounter: Payer: Self-pay | Admitting: Obstetrics and Gynecology

## 2012-05-12 DIAGNOSIS — O99820 Streptococcus B carrier state complicating pregnancy: Secondary | ICD-10-CM | POA: Insufficient documentation

## 2012-05-17 ENCOUNTER — Ambulatory Visit (INDEPENDENT_AMBULATORY_CARE_PROVIDER_SITE_OTHER): Payer: BC Managed Care – PPO | Admitting: Obstetrics and Gynecology

## 2012-05-17 ENCOUNTER — Telehealth (HOSPITAL_COMMUNITY): Payer: Self-pay | Admitting: *Deleted

## 2012-05-17 ENCOUNTER — Ambulatory Visit: Payer: BC Managed Care – PPO

## 2012-05-17 ENCOUNTER — Encounter: Payer: Self-pay | Admitting: Obstetrics and Gynecology

## 2012-05-17 VITALS — BP 98/76 | Wt 235.5 lb

## 2012-05-17 DIAGNOSIS — O3660X0 Maternal care for excessive fetal growth, unspecified trimester, not applicable or unspecified: Secondary | ICD-10-CM

## 2012-05-17 DIAGNOSIS — O4190X0 Disorder of amniotic fluid and membranes, unspecified, unspecified trimester, not applicable or unspecified: Secondary | ICD-10-CM

## 2012-05-17 DIAGNOSIS — O288 Other abnormal findings on antenatal screening of mother: Secondary | ICD-10-CM

## 2012-05-17 DIAGNOSIS — IMO0002 Reserved for concepts with insufficient information to code with codable children: Secondary | ICD-10-CM

## 2012-05-17 NOTE — Progress Notes (Signed)
Pt has ?'s concerning her mucous plug  Requests cx check today

## 2012-05-17 NOTE — Progress Notes (Signed)
Doing well, but impatient! Cervix soft, 1 cm, 70%, vtx, -2 [place] NV for possible LGA (88%ile at LV)

## 2012-05-24 ENCOUNTER — Telehealth: Payer: Self-pay | Admitting: Obstetrics and Gynecology

## 2012-05-24 NOTE — Telephone Encounter (Signed)
Reviewed s/s uc, srom, vag bleeding, daily fetal kick counts to report, comfort measures. Encouragged 8 water daily and frequent voids. Do kick count now if baby does not move 10 times in 2 hours come to MAU at that time. If passes kick count may use benadryl for rest discussed. Lavera Guise, CNM

## 2012-05-25 ENCOUNTER — Other Ambulatory Visit: Payer: Self-pay

## 2012-05-25 ENCOUNTER — Ambulatory Visit (INDEPENDENT_AMBULATORY_CARE_PROVIDER_SITE_OTHER): Payer: BC Managed Care – PPO | Admitting: Obstetrics and Gynecology

## 2012-05-25 ENCOUNTER — Ambulatory Visit (INDEPENDENT_AMBULATORY_CARE_PROVIDER_SITE_OTHER): Payer: BC Managed Care – PPO

## 2012-05-25 ENCOUNTER — Encounter: Payer: Self-pay | Admitting: Obstetrics and Gynecology

## 2012-05-25 ENCOUNTER — Encounter (HOSPITAL_COMMUNITY): Payer: Self-pay

## 2012-05-25 ENCOUNTER — Inpatient Hospital Stay (HOSPITAL_COMMUNITY)
Admission: AD | Admit: 2012-05-25 | Discharge: 2012-05-25 | Disposition: A | Discharge status: Home / Self Care | Payer: BC Managed Care – PPO | Source: Ambulatory Visit | Attending: Obstetrics and Gynecology | Admitting: Obstetrics and Gynecology

## 2012-05-25 VITALS — BP 112/66 | Wt 239.0 lb

## 2012-05-25 DIAGNOSIS — O3660X0 Maternal care for excessive fetal growth, unspecified trimester, not applicable or unspecified: Secondary | ICD-10-CM

## 2012-05-25 DIAGNOSIS — O479 False labor, unspecified: Secondary | ICD-10-CM | POA: Insufficient documentation

## 2012-05-25 DIAGNOSIS — B951 Streptococcus, group B, as the cause of diseases classified elsewhere: Secondary | ICD-10-CM

## 2012-05-25 DIAGNOSIS — O26849 Uterine size-date discrepancy, unspecified trimester: Secondary | ICD-10-CM

## 2012-05-25 DIAGNOSIS — Z331 Pregnant state, incidental: Secondary | ICD-10-CM

## 2012-05-25 NOTE — Progress Notes (Signed)
[redacted]w[redacted]d Sono: LGA at 8 lbs 10 oz  >90%
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
## 2012-05-25 NOTE — MAU Note (Signed)
Patient is in with c/o ctx q66m since this afternoon. She states that she was 3cm today. Reports decreased fetal movement. Denies lof.

## 2012-05-25 NOTE — Progress Notes (Signed)
[redacted]w[redacted]d request cervix check.  Ultrasound shows:  SIUP  S>D     [place] EDD: 05/31/2012           AFI: 13.2 cm           Cervical length: n/a           Placenta localization: anterior           Fetal presentation: vertex                   Anatomy survey is normal           Gender : female Comment: vertex presentation. Anterior placenta. Normal fluid. AFI = 50th%tile. EFW = 3989 grams (8 lb 12 oz) >90%tile. Cx not measured.

## 2012-05-26 ENCOUNTER — Encounter (HOSPITAL_COMMUNITY): Payer: Self-pay | Admitting: *Deleted

## 2012-05-26 ENCOUNTER — Inpatient Hospital Stay (HOSPITAL_COMMUNITY): Payer: BC Managed Care – PPO | Admitting: Anesthesiology

## 2012-05-26 ENCOUNTER — Encounter (HOSPITAL_COMMUNITY): Payer: Self-pay | Admitting: Anesthesiology

## 2012-05-26 ENCOUNTER — Inpatient Hospital Stay (HOSPITAL_COMMUNITY)
Admission: AD | Admit: 2012-05-26 | Discharge: 2012-05-28 | DRG: 373 | Disposition: A | Discharge status: Home / Self Care | Payer: BC Managed Care – PPO | Source: Ambulatory Visit | Attending: Obstetrics and Gynecology | Admitting: Obstetrics and Gynecology

## 2012-05-26 DIAGNOSIS — O26899 Other specified pregnancy related conditions, unspecified trimester: Secondary | ICD-10-CM

## 2012-05-26 DIAGNOSIS — IMO0002 Reserved for concepts with insufficient information to code with codable children: Secondary | ICD-10-CM

## 2012-05-26 DIAGNOSIS — Z22330 Carrier of Group B streptococcus: Secondary | ICD-10-CM

## 2012-05-26 DIAGNOSIS — O99820 Streptococcus B carrier state complicating pregnancy: Secondary | ICD-10-CM

## 2012-05-26 DIAGNOSIS — Z87898 Personal history of other specified conditions: Secondary | ICD-10-CM | POA: Insufficient documentation

## 2012-05-26 DIAGNOSIS — O99892 Other specified diseases and conditions complicating childbirth: Secondary | ICD-10-CM | POA: Diagnosis present

## 2012-05-26 DIAGNOSIS — Z331 Pregnant state, incidental: Secondary | ICD-10-CM

## 2012-05-26 DIAGNOSIS — O3660X0 Maternal care for excessive fetal growth, unspecified trimester, not applicable or unspecified: Secondary | ICD-10-CM | POA: Diagnosis present

## 2012-05-26 DIAGNOSIS — O26849 Uterine size-date discrepancy, unspecified trimester: Secondary | ICD-10-CM

## 2012-05-26 DIAGNOSIS — Z6791 Unspecified blood type, Rh negative: Secondary | ICD-10-CM

## 2012-05-26 DIAGNOSIS — F419 Anxiety disorder, unspecified: Secondary | ICD-10-CM

## 2012-05-26 LAB — OB RESULTS CONSOLE RUBELLA ANTIBODY, IGM: Rubella: IMMUNE

## 2012-05-26 LAB — US OB FOLLOW UP

## 2012-05-26 LAB — TYPE AND SCREEN
ABO/RH(D): A NEG
Antibody Screen: NEGATIVE

## 2012-05-26 LAB — CBC
HCT: 36.1 % (ref 36.0–46.0)
MCH: 28.7 pg (ref 26.0–34.0)
MCHC: 32.4 g/dL (ref 30.0–36.0)
MCV: 88.5 fL (ref 78.0–100.0)
RDW: 14.5 % (ref 11.5–15.5)

## 2012-05-26 MED ORDER — HYDROXYZINE HCL 50 MG PO TABS
50.0000 mg | ORAL_TABLET | Freq: Once | ORAL | Status: AC
  Administered 2012-05-26: 50 mg via ORAL
  Filled 2012-05-26: qty 1

## 2012-05-26 MED ORDER — IBUPROFEN 600 MG PO TABS: 600.0000 mg | ORAL_TABLET | Freq: Four times a day (QID) | ORAL | Status: DC | PRN

## 2012-05-26 MED ORDER — EPHEDRINE 5 MG/ML INJ: 10.0000 mg | INTRAVENOUS | Status: DC | PRN

## 2012-05-26 MED ORDER — LACTATED RINGERS IV SOLN
500.0000 mL | Freq: Once | INTRAVENOUS | Status: AC
  Administered 2012-05-26: 500 mL via INTRAVENOUS

## 2012-05-26 MED ORDER — OXYTOCIN 40 UNITS IN LACTATED RINGERS INFUSION - SIMPLE MED
62.5000 mL/h | Freq: Once | INTRAVENOUS | Status: AC
  Administered 2012-05-26: 62.5 mL/h via INTRAVENOUS

## 2012-05-26 MED ORDER — DIBUCAINE 1 % RE OINT: 1.0000 "application " | TOPICAL_OINTMENT | RECTAL | Status: DC | PRN

## 2012-05-26 MED ORDER — ONDANSETRON HCL 4 MG/2ML IJ SOLN: 4.0000 mg | INTRAMUSCULAR | Status: DC | PRN

## 2012-05-26 MED ORDER — NALBUPHINE HCL 10 MG/ML IJ SOLN
10.0000 mg | Freq: Once | INTRAMUSCULAR | Status: AC
  Administered 2012-05-26: 10 mg via SUBCUTANEOUS
  Filled 2012-05-26: qty 1

## 2012-05-26 MED ORDER — LACTATED RINGERS IV SOLN
500.0000 mL | INTRAVENOUS | Status: DC | PRN
Start: 2012-05-26 — End: 2012-05-26
  Administered 2012-05-26: 500 mL via INTRAVENOUS

## 2012-05-26 MED ORDER — OXYTOCIN 40 UNITS IN LACTATED RINGERS INFUSION - SIMPLE MED
1.0000 m[IU]/min | INTRAVENOUS | Status: DC
  Administered 2012-05-26: 2 m[IU]/min via INTRAVENOUS
  Filled 2012-05-26: qty 1000

## 2012-05-26 MED ORDER — FENTANYL 2.5 MCG/ML BUPIVACAINE 1/10 % EPIDURAL INFUSION (WH - ANES)
14.0000 mL/h | INTRAMUSCULAR | Status: DC
  Administered 2012-05-26 (×2): 14 mL/h via EPIDURAL
  Filled 2012-05-26 (×3): qty 60

## 2012-05-26 MED ORDER — LIDOCAINE HCL (PF) 1 % IJ SOLN
INTRAMUSCULAR | Status: DC | PRN
  Administered 2012-05-26: 30 mL
  Administered 2012-05-26 (×2): 5 mL

## 2012-05-26 MED ORDER — DIPHENHYDRAMINE HCL 50 MG/ML IJ SOLN: 12.5000 mg | INTRAMUSCULAR | Status: DC | PRN

## 2012-05-26 MED ORDER — FLEET ENEMA 7-19 GM/118ML RE ENEM: 1.0000 | ENEMA | RECTAL | Status: DC | PRN

## 2012-05-26 MED ORDER — TETANUS-DIPHTH-ACELL PERTUSSIS 5-2.5-18.5 LF-MCG/0.5 IM SUSP
0.5000 mL | Freq: Once | INTRAMUSCULAR | Status: DC
  Filled 2012-05-26: qty 0.5

## 2012-05-26 MED ORDER — FENTANYL 2.5 MCG/ML BUPIVACAINE 1/10 % EPIDURAL INFUSION (WH - ANES)
INTRAMUSCULAR | Status: DC | PRN
Start: 2012-05-26 — End: 2012-05-27
  Administered 2012-05-26: 14 mL/h via EPIDURAL

## 2012-05-26 MED ORDER — PHENYLEPHRINE 40 MCG/ML (10ML) SYRINGE FOR IV PUSH (FOR BLOOD PRESSURE SUPPORT): 80.0000 ug | PREFILLED_SYRINGE | INTRAVENOUS | Status: DC | PRN

## 2012-05-26 MED ORDER — PRENATAL MULTIVITAMIN CH
1.0000 | ORAL_TABLET | Freq: Every day | ORAL | Status: DC
  Administered 2012-05-28: 1 via ORAL
  Filled 2012-05-26 (×2): qty 1

## 2012-05-26 MED ORDER — OXYTOCIN BOLUS FROM INFUSION
250.0000 mL | Freq: Once | INTRAVENOUS | Status: AC
  Administered 2012-05-26: 250 mL via INTRAVENOUS
  Filled 2012-05-26: qty 500

## 2012-05-26 MED ORDER — PENICILLIN G POTASSIUM 5000000 UNITS IJ SOLR
2.5000 10*6.[IU] | INTRAVENOUS | Status: DC
  Administered 2012-05-26 (×2): 2.5 10*6.[IU] via INTRAVENOUS
  Filled 2012-05-26 (×5): qty 2.5

## 2012-05-26 MED ORDER — CITRIC ACID-SODIUM CITRATE 334-500 MG/5ML PO SOLN: 30.0000 mL | ORAL | Status: DC | PRN

## 2012-05-26 MED ORDER — WITCH HAZEL-GLYCERIN EX PADS: 1.0000 "application " | MEDICATED_PAD | CUTANEOUS | Status: DC | PRN

## 2012-05-26 MED ORDER — ONDANSETRON HCL 4 MG/2ML IJ SOLN
4.0000 mg | Freq: Four times a day (QID) | INTRAMUSCULAR | Status: DC | PRN
  Administered 2012-05-26: 4 mg via INTRAVENOUS
  Filled 2012-05-26: qty 2

## 2012-05-26 MED ORDER — PHENYLEPHRINE 40 MCG/ML (10ML) SYRINGE FOR IV PUSH (FOR BLOOD PRESSURE SUPPORT)
80.0000 ug | PREFILLED_SYRINGE | INTRAVENOUS | Status: DC | PRN
  Filled 2012-05-26: qty 5

## 2012-05-26 MED ORDER — OXYCODONE-ACETAMINOPHEN 5-325 MG PO TABS: 1.0000 | ORAL_TABLET | ORAL | Status: DC | PRN

## 2012-05-26 MED ORDER — DIPHENHYDRAMINE HCL 25 MG PO CAPS: 25.0000 mg | ORAL_CAPSULE | Freq: Four times a day (QID) | ORAL | Status: DC | PRN

## 2012-05-26 MED ORDER — ACETAMINOPHEN 325 MG PO TABS: 650.0000 mg | ORAL_TABLET | Freq: Four times a day (QID) | ORAL | Status: DC | PRN

## 2012-05-26 MED ORDER — HYDROXYZINE HCL 50 MG/ML IM SOLN: 50.0000 mg | Freq: Four times a day (QID) | INTRAMUSCULAR | Status: DC | PRN

## 2012-05-26 MED ORDER — LIDOCAINE HCL (PF) 1 % IJ SOLN
30.0000 mL | INTRAMUSCULAR | Status: DC | PRN
  Administered 2012-05-26: 30 mL via SUBCUTANEOUS
  Filled 2012-05-26: qty 30

## 2012-05-26 MED ORDER — BENZOCAINE-MENTHOL 20-0.5 % EX AERO
1.0000 "application " | INHALATION_SPRAY | CUTANEOUS | Status: DC | PRN
  Administered 2012-05-27: 1 via TOPICAL
  Filled 2012-05-26: qty 56

## 2012-05-26 MED ORDER — SIMETHICONE 80 MG PO CHEW: 80.0000 mg | CHEWABLE_TABLET | ORAL | Status: DC | PRN

## 2012-05-26 MED ORDER — FENTANYL CITRATE 0.05 MG/ML IJ SOLN
100.0000 ug | INTRAMUSCULAR | Status: DC | PRN
  Administered 2012-05-26 (×2): 100 ug via INTRAVENOUS
  Filled 2012-05-26 (×2): qty 2

## 2012-05-26 MED ORDER — ZOLPIDEM TARTRATE 5 MG PO TABS: 5.0000 mg | ORAL_TABLET | Freq: Every evening | ORAL | Status: DC | PRN

## 2012-05-26 MED ORDER — PRENATAL MULTIVITAMIN CH
1.0000 | ORAL_TABLET | Freq: Every day | ORAL | Status: DC
  Administered 2012-05-27: 1 via ORAL

## 2012-05-26 MED ORDER — TERBUTALINE SULFATE 1 MG/ML IJ SOLN: 0.2500 mg | Freq: Once | INTRAMUSCULAR | Status: DC | PRN

## 2012-05-26 MED ORDER — SENNOSIDES-DOCUSATE SODIUM 8.6-50 MG PO TABS
2.0000 | ORAL_TABLET | Freq: Every day | ORAL | Status: DC
  Administered 2012-05-27: 2 via ORAL

## 2012-05-26 MED ORDER — LANOLIN HYDROUS EX OINT: TOPICAL_OINTMENT | CUTANEOUS | Status: DC | PRN

## 2012-05-26 MED ORDER — IBUPROFEN 600 MG PO TABS
600.0000 mg | ORAL_TABLET | Freq: Four times a day (QID) | ORAL | Status: DC
  Administered 2012-05-27 – 2012-05-28 (×5): 600 mg via ORAL
  Filled 2012-05-26 (×6): qty 1

## 2012-05-26 MED ORDER — ONDANSETRON HCL 4 MG PO TABS: 4.0000 mg | ORAL_TABLET | ORAL | Status: DC | PRN

## 2012-05-26 MED ORDER — PENICILLIN G POTASSIUM 5000000 UNITS IJ SOLR
5.0000 10*6.[IU] | Freq: Once | INTRAVENOUS | Status: AC
  Administered 2012-05-26: 5 10*6.[IU] via INTRAVENOUS
  Filled 2012-05-26: qty 5

## 2012-05-26 MED ORDER — ACETAMINOPHEN 325 MG PO TABS
650.0000 mg | ORAL_TABLET | ORAL | Status: DC | PRN
  Administered 2012-05-26: 650 mg via ORAL
  Filled 2012-05-26: qty 2

## 2012-05-26 MED ORDER — HYDROXYZINE HCL 50 MG PO TABS: 50.0000 mg | ORAL_TABLET | Freq: Four times a day (QID) | ORAL | Status: DC | PRN

## 2012-05-26 MED ORDER — LACTATED RINGERS IV SOLN
INTRAVENOUS | Status: DC
  Administered 2012-05-26: 05:00:00 via INTRAVENOUS
  Administered 2012-05-26 (×2): 125 mL/h via INTRAVENOUS
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
## 2012-05-26 NOTE — Anesthesia Preprocedure Evaluation (Signed)

## 2012-05-26 NOTE — Progress Notes (Signed)
Comfortable, some pressure. Patient at 17.08 hrs Tried pushing at 17.08 hrs and pushed for 1 hour and getting tired, poor maternal effect with pushing. Patient to labor down with epidural O VSS BP 122/49  Pulse 94  Temp 100 F (37.8 C) (Oral)  Resp 20  SpO2 100%  LMP 08/30/2011 Tylenol 650mg s po for elevated temp      fhts category 1 baseline 155 - 160 bpm      abd soft between uc      Contractions 1:1 - 2 mins       Vag 10/100/+1. A fully dilated since 17.08 hrs - to labor down P Continue care and labor down x 1 hr  Earl Gala, CNM.

## 2012-05-26 NOTE — Progress Notes (Signed)
Comfortable, with dense epidural O VSS BP 115/65  Pulse 78  Temp 97.8 F (36.6 C) (Oral)  Resp 18  SpO2 100%  LMP 08/30/2011       fhts category 1 baseline 135 bpm      abd soft between uc      Contractions 1 : 5 - 6 mins spacing      Vag 4 -5/ 80%/ -1, very prominent Symphysis Pubis medially to outlet      AROM with min return but bloody mucus      GbS pos+, now has 2 doses IV ABX A  Labor  Progressing slowly P Augment with Pitocin, continue care  Earl Gala, CNM.

## 2012-05-26 NOTE — Anesthesia Procedure Notes (Signed)
Epidural Patient location during procedure: OB Start time: 05/26/2012 8:28 AM  Staffing Anesthesiologist: Brayton Caves R Performed by: anesthesiologist   Preanesthetic Checklist Completed: patient identified, site marked, surgical consent, pre-op evaluation, timeout performed, IV checked, risks and benefits discussed and monitors and equipment checked  Epidural Patient position: sitting Prep: site prepped and draped and DuraPrep Patient monitoring: continuous pulse ox and blood pressure Approach: midline Injection technique: LOR air and LOR saline  Needle:  Needle type: Tuohy  Needle gauge: 17 G Needle length: 9 cm and 9 Needle insertion depth: 6 cm Catheter type: closed end flexible Catheter size: 19 Gauge Catheter at skin depth: 12 cm Test dose: negative  Assessment Events: blood not aspirated, injection not painful, no injection resistance, negative IV test and no paresthesia  Additional Notes Patient identified.  Risk benefits discussed including failed block, incomplete pain control, headache, nerve damage, paralysis, blood pressure changes, nausea, vomiting, reactions to medication both toxic or allergic, and postpartum back pain.  Patient expressed understanding and wished to proceed.  All questions were answered.  Sterile technique used throughout procedure and epidural site dressed with sterile barrier dressing. No paresthesia or other complications noted.The patient did not experience any signs of intravascular injection such as tinnitus or metallic taste in mouth nor signs of intrathecal spread such as rapid motor block. Please see nursing notes for vital signs.

## 2012-05-26 NOTE — Progress Notes (Signed)
Comfortable, some pressure O VSS BP 129/78  Pulse 105  Temp 97.8 F (36.6 C) (Oral)  Resp 20  LMP 08/30/2011       fhts category 1      abd soft between uc      Contractions 1:2 - 3 mins      Vag 4/ 90%/+1, Vx well applied to Cx.      To have 2nd dose of Penicillin prior to AROM A Progressing well P Continue care, prep for Epidural.  Earl Gala, CNM.

## 2012-05-27 LAB — CBC
HCT: 28.4 % — ABNORMAL LOW (ref 36.0–46.0)
Hemoglobin: 9.2 g/dL — ABNORMAL LOW (ref 12.0–15.0)
MCHC: 32.4 g/dL (ref 30.0–36.0)
RBC: 3.18 MIL/uL — ABNORMAL LOW (ref 3.87–5.11)

## 2012-05-27 MED ORDER — RHO D IMMUNE GLOBULIN 1500 UNIT/2ML IJ SOLN
300.0000 ug | Freq: Once | INTRAMUSCULAR | Status: AC
  Administered 2012-05-27: 300 ug via INTRAMUSCULAR
  Filled 2012-05-27: qty 2

## 2012-05-27 NOTE — Anesthesia Postprocedure Evaluation (Signed)
Anesthesia Post Note  Patient: Brandy Patterson  Procedure(s) Performed: * No procedures listed *  Anesthesia type: Epidural  Patient location: Mother/Baby  Post pain: Pain level controlled  Post assessment: Post-op Vital signs reviewed  Last Vitals: BP 120/74  Pulse 94  Temp 37 C (Oral)  Resp 18  SpO2 100%  LMP 08/30/2011  Breastfeeding? Unknown  Post vital signs: Reviewed  Level of consciousness: awake  Complications: No apparent anesthesia complications

## 2012-05-27 NOTE — Progress Notes (Signed)
Patient ID: Brandy Patterson, female   DOB: 1991/04/13, 21 y.o.   MRN: [id number] Post Partum Day1 Subjective: no complaints, voiding and tolerating PO and breast feeding  Objective: Afebrile VSS  Physical Exam:  General: alert Lochia: appropriate Uterine Fundus: firm  DVT Evaluation: No evidence of DVT seen on physical exam.   Basename 05/27/12 0615 05/26/12 0525  HGB 9.2* 11.7*  HCT 28.4* 36.1    Assessment/Plan: PPD 1 Pt breast feeding Routine care   LOS: 1 day   Brandy Patterson A 05/27/2012, 6:09 PM

## 2012-05-28 LAB — RH IG WORKUP (INCLUDES ABO/RH): Gestational Age(Wks): 38

## 2012-05-28 MED ORDER — IBUPROFEN 600 MG PO TABS: 600.0000 mg | ORAL_TABLET | Freq: Four times a day (QID) | ORAL | Status: AC | PRN

## 2012-05-28 MED ORDER — NORETHINDRONE 0.35 MG PO TABS: 1.0000 | ORAL_TABLET | Freq: Every day | ORAL | Status: DC

## 2012-05-28 NOTE — Progress Notes (Signed)
Patient was referred for history of depression/anxiety. * Referral screened out by Clinical Social Worker because none of the following criteria appear to apply:  ~ History of anxiety/depression during this pregnancy, or of post-partum depression.  ~ Diagnosis of anxiety and/or depression within last 3 years  ~ History of depression due to pregnancy loss/loss of child  OR * Patient's symptoms currently being treated with medication and/or therapy.  Please contact the Clinical Social Worker if needs arise, or by the patient's request. Pt was anxious at the beginning of pregnancy briefly and prescribed Xanax, of which she took one time.

## 2012-05-28 NOTE — Discharge Summary (Signed)
Physician Discharge Summary  Patient ID: Brandy Patterson MRN: [id number] DOB/AGE: 11-25-1990 21 y.o.  Admit date: 05/26/2012 Discharge date: 05/28/2012  Admission Diagnoses: This patient has been to MAU 05/25/12 in prodromal labor. Reevaluated on 05/26/12 and now contracting 2 - 5 mins                                         Cx = 4/80/+1                                         Active labor [redacted]w[redacted]d                                         GBS: pos+    Discharge Diagnoses:  Principal Problem:  *NSVD (normal spontaneous vaginal delivery)   Discharged Condition: stable  Hospital Course: Admitted in active labor at [redacted]w[redacted]d, GBS pos+, GBS prophylaxis with penicillin. AROM and augmented labor contraction pattern with Pitocin. Progressed to have a NSVD viable female.                               "[name]". 1 st degree labial tear. Repaired 3/0 Vicryl repair. Both mother and baby tolerated labor well. Postpartum course uncomplicated. Lactating mother.                                 Both Mother and baby discharged  D2 PP.                                Birth Control:Micronor.    Significant Diagnostic Studies: none  Treatments: IV hydration, antibiotics: Penicillin and analgesia:epidural  Discharge Exam: Blood pressure 118/87, pulse 80, temperature 97.6 F (36.4 C), temperature source Oral, resp. rate 18, last menstrual period 08/30/2011, SpO2 97.00%, unknown if currently breastfeeding. General appearance: alert, cooperative and no distress Affect: AAO x 3 Lungs: CTAB CV: RRR GI: Normal Abdomen: soft/ NT/ BS x 4/ uterus -2 /u well contracted GU: Normal, Lochia Moderate. Extremities:  Lower limbs +1 edema.  Disposition: 01-Home or Self Care  Discharge Orders    Future Orders Please Complete By Expires   OB RESULTS CONSOLE Rubella Antibody      Comments:   This external order was created through the Results Console.     Medication List  As of 05/28/2012 12:19 PM   ASK your doctor  about these medications         prenatal multivitamin Tabs   Take 1 tablet by mouth daily.           Follow-up Information    Follow up with [facility]. Schedule an appointment as soon as possible for a visit in 6 weeks. (As needed)        Advised to follow [facility] Manual. NSVD, Viable female "[name]" with DD   Signed: Earl Gala, CNM. 05/28/2012, 12:19 PM

## 2012-06-01 ENCOUNTER — Encounter: Payer: BC Managed Care – PPO | Admitting: Obstetrics and Gynecology

## 2012-06-16 NOTE — Telephone Encounter (Signed)
Preadmission screen  

## 2012-07-06 ENCOUNTER — Encounter: Payer: Self-pay | Admitting: Obstetrics and Gynecology

## 2012-07-06 ENCOUNTER — Ambulatory Visit (INDEPENDENT_AMBULATORY_CARE_PROVIDER_SITE_OTHER): Payer: BC Managed Care – PPO | Admitting: Obstetrics and Gynecology

## 2012-07-06 MED ORDER — NORETHINDRONE ACET-ETHINYL EST 1.5-30 MG-MCG PO TABS: 1.0000 | ORAL_TABLET | Freq: Every day | ORAL | Status: DC

## 2012-07-06 NOTE — Progress Notes (Signed)
Brandy Patterson  is 6 weeks postpartum following a spontaneous vaginal delivery at 52 w gestational weeks Date: 05/26/2012 female baby named lilianna  delivered by [facility] .  Breastfeeding: yes Bottlefeeding:  no  Post-partum blues / depression:  no  EPDS score: 2  History of abnormal Pap:  yes  Last Pap: Date  12/10/2011 normal Gestational diabetes:  no  Contraception:  Desires oral progesterone-only contraceptive micronor   Normal urinary function:  yes Normal GI function:  yes Returning to work:  no  Subjective:     Brandy Patterson is a 21 y.o. female who presents for a postpartum visit.  I have fully reviewed the prenatal and intrapartum course.    Patient is sexually active.   The following portions of the patient's history were reviewed and updated as appropriate: allergies, current medications, past family history, past medical history, past social history, past surgical history and problem list.  Review of Systems Pertinent items are noted in HPI.   Objective:    BP 104/60  Wt 208 lb (94.348 kg)  Breastfeeding? No  General:  alert, cooperative and no distress     Lungs: clear to auscultation bilaterally  Heart:  regular rate and rhythm, S1, S2 normal, no murmur  Abdomen: soft, non-tender; bowel sounds normal; no masses,  no organomegaly   Vulva:  normal  Vagina: normal vagina  Cervix:  normal  Corpus: normal size, contour, position, consistency, mobility, non-tender  Adnexa:  normal adnexa             Assessment:     Normal postpartum exam.  Pap smear not done at today's visit.   Plan:   Loestrin 1.5/30 to start this Sunday BUM x 1 month AEX 11/2012  Silverio Lay MD 07/06/2012 11:37 AM

## 2012-07-21 ENCOUNTER — Telehealth: Payer: Self-pay | Admitting: Obstetrics and Gynecology

## 2012-07-21 NOTE — Telephone Encounter (Signed)
TC to pt. LM to return call.  

## 2012-07-21 NOTE — Telephone Encounter (Signed)
TC to pt.  Per LD after consult with DR SR, advised pt to monitor.  Informed may take up to 3 months for menses to regulate.  If bleeding increases or does not decrease to call back.  Pt verbalizes comprehension.

## 2012-07-21 NOTE — Telephone Encounter (Signed)
VM from pt. States is on 2nd week of new OCP. This is 2nd one she tried.  She still is having heavy bleeding. Wants to try something else.

## 2012-07-21 NOTE — Telephone Encounter (Signed)
TC to pt. States started LoEstrin 06/28/12.  Bleeding stopped for 1-2 days. Started bleeding again about 07/10/12/ or 10/201/13  and now is changing tampon q 2 h. Has never had heavy menses.  No missed or late pills.  Questioning what else she should try.  Will consult with DR SR.Pt agreeable.  To call if using tampon or pad q hr.

## 2012-09-22 DIAGNOSIS — N751 Abscess of Bartholin's gland: Secondary | ICD-10-CM

## 2012-09-22 HISTORY — DX: Abscess of Bartholin's gland: N75.1

## 2012-10-03 ENCOUNTER — Ambulatory Visit (INDEPENDENT_AMBULATORY_CARE_PROVIDER_SITE_OTHER): Payer: BC Managed Care – PPO | Admitting: Physician Assistant

## 2012-10-03 VITALS — BP 126/76 | HR 96 | Temp 99.0°F | Resp 17 | Ht 65.5 in | Wt 188.0 lb

## 2012-10-03 DIAGNOSIS — N750 Cyst of Bartholin's gland: Secondary | ICD-10-CM

## 2012-10-03 NOTE — Patient Instructions (Signed)
If you develop increased pain and/or swelling of the area, you need to be evaluated for possible incision and drainage of the cyst and initiation of antibiotics.

## 2012-10-03 NOTE — Progress Notes (Signed)
Subjective:    Patient ID: Brandy Patterson, female    DOB: December 22, 1990, 22 y.o.   MRN: [id number]  HPI This 22 y.o. female presents for evaluation of a lump inside her vagina. 4 days ago during sex, felt like "something was inflamed, like it was closed up in there."  Investigated last night and felt "a ball" on the right side. "It's more hard and more big than a boil."  Googled it last night and wonders if it might be a cyst.  Her husband looked and said it doesn't have "a head" on it, like boils usually do. No fever, chills.  No urinary symptoms, no change in vaginal discharge.   Past Medical History  Diagnosis Date  . UTI (lower urinary tract infection)   . Bacterial vaginosis    Hidradenitis (groin)   . Rh negative status during pregnancy   . Anxiety   . Abnormal Pap smear   . Endocrinopathy     Past Surgical History  Procedure Date  . Hydradenitis excision 2010    groin  . Mouth surgery   . Wisdom tooth extraction     Prior to Admission medications   Medication Sig Start Date End Date Taking? Authorizing Provider  Norethindrone Acetate-Ethinyl Estradiol (LOESTRIN 1.5/30, 21,) 1.5-30 MG-MCG tablet Take 1 tablet by mouth daily. 07/06/12  Yes Esmeralda Arthur, MD  Prenatal Vit-Fe Fumarate-FA (PRENATAL MULTIVITAMIN) TABS Take 1 tablet by mouth daily.   Yes Historical Provider, MD    Allergies  Allergen Reactions  . Cephalexin Hives  . Doxycycline Hives  . Erythromycin Hives    History   Social History  . Marital Status: Married    Spouse Name: Rexford Maus    Number of Children: 1  . Years of Education: 14   Occupational History  . [employer]    Social History Main Topics  . Smoking status: Never Smoker   . Smokeless tobacco: Never Used  . Alcohol Use: No  . Drug Use: No  . Sexually Active: Yes -- Female partner(s)    Birth Control/ Protection: Pill     Comment:     Other Topics Concern  . Not on file   Social History Narrative   Lives with her husband  and their daughter.    Family History  Problem Relation Age of Onset  . Diabetes Father   . Hypertension Father   . Thyroid disease Father   . Hypertension Maternal Grandmother   . Diabetes Maternal Grandmother   . Hypertension Maternal Grandfather   . Diabetes Maternal Grandfather   . Cancer Maternal Grandfather   . Anemia Neg Hx   . Migraines Mother   . Aneurysm Mother 76    2006; cerebral aneurysm  . Seizures Sister     during childhood  . Syncope episode Sister     adolescence    Review of Systems As above.    Objective:   Physical Exam Blood pressure 126/76, pulse 96, temperature 99 F (37.2 C), temperature source Oral, resp. rate 17, height 5' 5.5" (1.664 m), weight 188 lb (85.276 kg), SpO2 100.00%, not currently breastfeeding. Body mass index is 30.81 kg/(m^2). Well-developed, well nourished WF who is awake, alert and oriented, in NAD. Her mother and infant daughter are here with her today. HEENT: [place]/AT, sclera and conjunctiva are clear.   Lungs: normal effort Extremities: no cyanosis, clubbing or edema. Skin: warm and dry without rash. Psychologic: good mood and appropriate affect, normal speech and behavior. Genitalia:  Normal  female external genitalia except for a moderate bulge noted on the right.  A cyst is palpable just within the introitus.  Mildly tender on deep palpation.  No fluctuance.       Assessment & Plan:   1. Bartholin's cyst  Ambulatory referral to Obstetrics / Gynecology   Patient Instructions  If you develop increased pain and/or swelling of the area, you need to be evaluated for possible incision and drainage of the cyst and initiation of antibiotics.

## 2012-10-04 ENCOUNTER — Telehealth: Payer: Self-pay | Admitting: Obstetrics and Gynecology

## 2012-10-04 ENCOUNTER — Encounter: Payer: Self-pay | Admitting: Obstetrics and Gynecology

## 2012-10-04 ENCOUNTER — Ambulatory Visit (INDEPENDENT_AMBULATORY_CARE_PROVIDER_SITE_OTHER): Payer: BC Managed Care – PPO | Admitting: Obstetrics and Gynecology

## 2012-10-04 VITALS — BP 110/72 | Temp 97.8°F | Wt 188.0 lb

## 2012-10-04 DIAGNOSIS — L0291 Cutaneous abscess, unspecified: Secondary | ICD-10-CM

## 2012-10-04 DIAGNOSIS — N751 Abscess of Bartholin's gland: Secondary | ICD-10-CM

## 2012-10-04 MED ORDER — IBUPROFEN 600 MG PO TABS: ORAL_TABLET | ORAL | Status: DC

## 2012-10-04 MED ORDER — HYDROCODONE-ACETAMINOPHEN 5-500 MG PO TABS: 1.0000 | ORAL_TABLET | Freq: Four times a day (QID) | ORAL | Status: DC | PRN

## 2012-10-04 NOTE — Progress Notes (Signed)
21 YO complains of uncomfortable swelling in vaginal area and was seen by Urgent Care ([place]) and advised to come for evaluation. Symptoms have been present for 5 days and is more uncomfortable.  O: Pelvic: EGBUS-right inferior vulva with 2.5 x 2 cm Bartholin's Abscess- I & D per protocol yielding moderate purulent material; Word     Catheter placed  A: Right Bartholin's Abscess- I & D     Placement of  Word Catheter  P: Warm water soaks x 15 minutes daily for the next 5 days      Ibuprofen 600 mg # 30 1 po pc q 6 hours x 5 days then prn      Vicodin 5/300 #12  1 po q 4 hours prn moderate to severe pain no refill (called in to pharmacy)      RTO-1 week for Word Catheter removal  Tiffney Haughton, PA-C

## 2012-10-04 NOTE — Telephone Encounter (Signed)
Pt was seen at urgent care for bartholin's cyst. States that she was told she needed to be seen ASAP. Apt given with EP for today 10/04/12 @ 2:15   Darien Ramus, CMA

## 2012-10-04 NOTE — Telephone Encounter (Signed)
sr pt 

## 2012-10-04 NOTE — Telephone Encounter (Signed)
Called in prescription for vicodin 5/300 #12 1 po every 4 hours as needed for moderate to severe pain with no refills.   Axl Rodino, PA-C

## 2012-10-08 ENCOUNTER — Encounter: Payer: Self-pay | Admitting: Obstetrics and Gynecology

## 2012-10-08 ENCOUNTER — Ambulatory Visit: Payer: BC Managed Care – PPO | Admitting: Obstetrics and Gynecology

## 2012-10-08 ENCOUNTER — Telehealth: Payer: Self-pay | Admitting: Obstetrics and Gynecology

## 2012-10-08 VITALS — BP 114/70 | Temp 98.1°F

## 2012-10-08 DIAGNOSIS — IMO0001 Reserved for inherently not codable concepts without codable children: Secondary | ICD-10-CM

## 2012-10-08 DIAGNOSIS — N760 Acute vaginitis: Secondary | ICD-10-CM

## 2012-10-08 DIAGNOSIS — R102 Pelvic and perineal pain: Secondary | ICD-10-CM

## 2012-10-08 DIAGNOSIS — N751 Abscess of Bartholin's gland: Secondary | ICD-10-CM

## 2012-10-08 LAB — POCT URINALYSIS DIPSTICK

## 2012-10-08 LAB — POCT URINE PREGNANCY: Preg Test, Ur: NEGATIVE

## 2012-10-08 MED ORDER — FLUCONAZOLE 150 MG PO TABS: 150.0000 mg | ORAL_TABLET | Freq: Once | ORAL | Status: DC

## 2012-10-08 MED ORDER — METRONIDAZOLE 500 MG PO TABS: 500.0000 mg | ORAL_TABLET | Freq: Two times a day (BID) | ORAL | Status: DC

## 2012-10-08 NOTE — Telephone Encounter (Signed)
Tc to pt per telephone call. Appt sched today @ 1:30 for removal of word cath. Pt agrees.

## 2012-10-08 NOTE — Progress Notes (Signed)
21 YO S/P Bartholin's Abscess (right) I & D for removal of Word Catheter-states its too painful.  Also reports malodorous vaginal discharge.  O: Pelvic: EGBUS-Word Catheter removed without difficulty with I & D site revealing a granulomatous base       Wet Prep: ph 5, whiff +, few clue cells  A: Word Catheter Removal     S/P Bartholins's Abscess I & D     Bacterial Vaginosis  P: Metronidazole 500 mg #14 bid x 7 days no refill       Diflucan 150 mg #1 1 po stat 1 refill      Continue warm water soaks for the next 5 days       RTO- as scheduled or prn  Brandy Madura, PA-C

## 2012-10-11 ENCOUNTER — Encounter: Payer: BC Managed Care – PPO | Admitting: Obstetrics and Gynecology

## 2012-12-01 ENCOUNTER — Ambulatory Visit (INDEPENDENT_AMBULATORY_CARE_PROVIDER_SITE_OTHER): Payer: BC Managed Care – PPO | Admitting: Physician Assistant

## 2012-12-01 VITALS — BP 120/70 | HR 65 | Temp 98.2°F | Resp 18 | Ht 65.5 in | Wt 180.0 lb

## 2012-12-01 DIAGNOSIS — Z0000 Encounter for general adult medical examination without abnormal findings: Secondary | ICD-10-CM

## 2012-12-01 DIAGNOSIS — R8281 Pyuria: Secondary | ICD-10-CM

## 2012-12-01 LAB — COMPREHENSIVE METABOLIC PANEL
Albumin: 4.2 g/dL (ref 3.5–5.2)
BUN: 12 mg/dL (ref 6–23)
Calcium: 9.1 mg/dL (ref 8.4–10.5)
Chloride: 107 mEq/L (ref 96–112)
Creat: 0.67 mg/dL (ref 0.50–1.10)
Glucose, Bld: 83 mg/dL (ref 70–99)
Potassium: 4.7 mEq/L (ref 3.5–5.3)

## 2012-12-01 LAB — POCT UA - MICROSCOPIC ONLY
Casts, Ur, LPF, POC: NEGATIVE
Mucus, UA: POSITIVE
Yeast, UA: NEGATIVE

## 2012-12-01 LAB — POCT URINALYSIS DIPSTICK
Bilirubin, UA: NEGATIVE
Blood, UA: NEGATIVE
Glucose, UA: NEGATIVE
Spec Grav, UA: 1.025
pH, UA: 7

## 2012-12-01 LAB — POCT CBC
HCT, POC: 39.9 % (ref 37.7–47.9)
Hemoglobin: 12.7 g/dL (ref 12.2–16.2)
Lymph, poc: 1.5 (ref 0.6–3.4)
MCH, POC: 29.3 pg (ref 27–31.2)
MCHC: 31.8 g/dL (ref 31.8–35.4)
MPV: 9.7 fL (ref 0–99.8)
POC MID %: 10.9 %M (ref 0–12)
RBC: 4.34 M/uL (ref 4.04–5.48)
WBC: 3.6 10*3/uL — AB (ref 4.6–10.2)

## 2012-12-01 NOTE — Progress Notes (Signed)
Patient ID: Brandy Patterson MRN: [id number], DOB: 1991-06-18, 22 y.o. Date of Encounter: 12/01/2012, 12:37 PM  Primary Physician: Esmeralda Arthur, MD  Chief Complaint: Physical (CPE)  HPI: 22 y.o. y/o female with history of noted below here for CPE. Doing well. No issues/complaints. She is uncertain as to when her last CPE was. Recently had a little girl 23 months ago. Doing quite well. Sleeping from 9 PM to 5 AM. 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 No depression. Needs form completed for work. Last Pap, September 2013, normal.   States that her mother had a cerebral aneurysm in 2006 that ruptured. She did survive this. At the time of this event her mother was in her 61's and the patient is wondering if she should ever be evaluated for this. She states that she was told she should be scanned yearly. She is asymptomatic and always has been.    Review of Systems: Consitutional: No fever, chills, fatigue, night sweats, lymphadenopathy, or weight changes. Eyes: No visual changes, eye redness, or discharge. ENT/Mouth: Ears: No otalgia, tinnitus, hearing loss, discharge. Nose: No congestion, rhinorrhea, sinus pain, or epistaxis. Throat: No sore throat, post nasal drip, or teeth pain. Cardiovascular: No CP, palpitations, diaphoresis, DOE, edema, orthopnea, PND. Respiratory: No cough, hemoptysis, SOB, or wheezing. Gastrointestinal: No anorexia, dysphagia, reflux, pain, nausea, vomiting, hematemesis, diarrhea, constipation, BRBPR, or melena. Breast: No discharge, pain, swelling, or mass. Genitourinary: No dysuria, frequency, urgency, hematuria, incontinence, nocturia, amenorrhea, vaginal discharge, pruritis, burning, abnormal bleeding, or pain. Musculoskeletal: No decreased ROM, myalgias, stiffness, joint swelling, or weakness. Skin: No rash, erythema, lesion changes, pain, warmth, jaundice, or pruritis. Neurological: No headache, dizziness, syncope, seizures,  tremors, memory loss, coordination problems, or paresthesias. Psychological: No anxiety, depression, hallucinations, SI/HI. Endocrine: No fatigue, polydipsia, polyphagia, polyuria, or known diabetes. All other systems were reviewed and are otherwise negative.  Past Medical History  Diagnosis Date  . UTI (lower urinary tract infection)   . Bacterial vaginosis   . Rh negative status during pregnancy   . Anxiety   . Abnormal Pap smear   . Endocrinopathy   . Bartholin's gland abscess 09/2012    Right  side;  I & D with Word Catheter Placement     Past Surgical History  Procedure Laterality Date  . Hydradenitis excision  2010    groin  . Mouth surgery    . Wisdom tooth extraction      Home Meds:  Prior to Admission medications   Medication Sig Start Date End Date Taking? Authorizing Tryce Surratt  Norethindrone Acetate-Ethinyl Estradiol (LOESTRIN 1.5/30, 21,) 1.5-30 MG-MCG tablet Take 1 tablet by mouth daily. 07/06/12  Yes Esmeralda Arthur, MD                                       Allergies:  Allergies  Allergen Reactions  . Cephalexin Hives  . Doxycycline Hives  . Erythromycin Hives  . Vicodin (Hydrocodone-Acetaminophen)     History   Social History  . Marital Status: Married    Spouse Name: Rexford Maus    Number of Children: 1  . Years of Education: 14   Occupational History  . [employer]    Social History Main Topics  . Smoking status: Never Smoker   . Smokeless tobacco: Never Used  . Alcohol Use: No  . Drug Use: No  . Sexually  Active: Yes -- Female partner(s)    Birth Control/ Protection: Pill     Comment:  loestrin 1.5/30   Other Topics Concern  . Not on file   Social History Narrative   Lives with her husband and their daughter.    Family History  Problem Relation Age of Onset  . Diabetes Father   . Hypertension Father   . Thyroid disease Father   . Hypertension Maternal Grandmother   . Diabetes Maternal Grandmother   . Hypertension Maternal  Grandfather   . Diabetes Maternal Grandfather   . Cancer Maternal Grandfather   . Anemia Neg Hx   . Migraines Mother   . Aneurysm Mother 15    2006; cerebral aneurysm  . Seizures Sister     during childhood  . Syncope episode Sister     adolescence    Physical Exam: Blood pressure 120/70, pulse 65, temperature 98.2 F (36.8 C), temperature source Oral, resp. rate 18, height 5' 5.5" (1.664 m), weight 180 lb (81.647 kg), SpO2 100.00%., Body mass index is 29.49 kg/(m^2). General: Well developed, well nourished, in no acute distress. HEENT: Normocephalic, atraumatic. Conjunctiva pink, sclera non-icteric. Pupils 2 mm constricting to 1 mm, round, regular, and equally reactive to light and accomodation. EOMI. Internal auditory canal clear. TMs with good cone of light and without pathology. Nasal mucosa pink. Nares are without discharge. No sinus tenderness. Oral mucosa pink. Dentition normal. Pharynx without exudate.   Neck: Supple. Trachea midline. No thyromegaly. Full ROM. No lymphadenopathy. Lungs: Clear to auscultation bilaterally without wheezes, rales, or rhonchi. Breathing is of normal effort and unlabored. Cardiovascular: RRR with S1 S2. No murmurs, rubs, or gallops appreciated. Distal pulses 2+ symmetrically. No carotid or abdominal bruits. Abdomen: Soft, non-tender, non-distended with normoactive bowel sounds. No hepatosplenomegaly or masses. No rebound/guarding. No CVA tenderness. Without hernias.  Musculoskeletal: Full range of motion and 5/5 strength throughout. Without swelling, atrophy, tenderness, crepitus, or warmth. Extremities without clubbing, cyanosis, or edema. Calves supple. Skin: Warm and moist without erythema, ecchymosis, wounds, or rash. Neuro: A+Ox3. CN II-XII grossly intact. Moves all extremities spontaneously. Full sensation throughout. Normal gait. DTR 2+ throughout upper and lower extremities. Finger to nose intact. Psych:  Responds to questions appropriately with a  normal affect.   Studies:  Results for orders placed in visit on 12/01/12  POCT CBC      Result Value Range   WBC 3.6 (*) 4.6 - 10.2 K/uL   Lymph, poc 1.5  0.6 - 3.4   POC LYMPH PERCENT 40.7  10 - 50 %L   MID (cbc) 0.4  0 - 0.9   POC MID % 10.9  0 - 12 %M   POC Granulocyte 1.7 (*) 2 - 6.9   Granulocyte percent 48.4  37 - 80 %G   RBC 4.34  4.04 - 5.48 M/uL   Hemoglobin 12.7  12.2 - 16.2 g/dL   HCT, POC 16.1  09.6 - 47.9 %   MCV 91.9  80 - 97 fL   MCH, POC 29.3  27 - 31.2 pg   MCHC 31.8  31.8 - 35.4 g/dL   RDW, POC 04.5     Platelet Count, POC 179  142 - 424 K/uL   MPV 9.7  0 - 99.8 fL  POCT UA - MICROSCOPIC ONLY      Result Value Range   WBC, Ur, HPF, POC 3-6     RBC, urine, microscopic 2-4     Bacteria, U Microscopic trace  Mucus, UA positive     Epithelial cells, urine per micros 3-6     Crystals, Ur, HPF, POC neg     Casts, Ur, LPF, POC neg     Yeast, UA neg    POCT URINALYSIS DIPSTICK      Result Value Range   Color, UA yellow     Clarity, UA clear     Glucose, UA neg     Bilirubin, UA neg     Ketones, UA trace     Spec Grav, UA 1.025     Blood, UA neg     pH, UA 7.0     Protein, UA 30     Urobilinogen, UA 1.0     Nitrite, UA neg     Leukocytes, UA Negative      CMP, TSH, and urine culture pending. Patient is not fasting.   Assessment/Plan:  22 y.o. female here for CPE with form completion and family history of brain aneurysm.  1) CPE -Healthy young adult female physical -Await labs -Healthy diet and exercise -Anticipatory guidance  2) Family history of brain aneurysm -States she was told to have yearly screenings -Advised patient to call her mother's neurosurgeon to ask them if she should have a screening image done, and if so how often. Patient is asymptomatic at this time and has always been.   3) Form completed -TB letter issued   Signed, Eula Listen, PA-C 12/01/2012 12:37 PM

## 2012-12-02 LAB — TSH: TSH: 1.818 u[IU]/mL (ref 0.350–4.500)

## 2012-12-03 LAB — URINE CULTURE
Colony Count: NO GROWTH
Organism ID, Bacteria: NO GROWTH

## 2012-12-05 ENCOUNTER — Encounter: Payer: Self-pay | Admitting: Physician Assistant

## 2013-08-28 ENCOUNTER — Ambulatory Visit: Payer: PRIVATE HEALTH INSURANCE

## 2013-08-28 ENCOUNTER — Ambulatory Visit (INDEPENDENT_AMBULATORY_CARE_PROVIDER_SITE_OTHER): Payer: BC Managed Care – PPO | Admitting: Emergency Medicine

## 2013-08-28 VITALS — BP 100/60 | HR 98 | Temp 98.4°F | Resp 16 | Ht 65.5 in | Wt 163.0 lb

## 2013-08-28 DIAGNOSIS — K5900 Constipation, unspecified: Secondary | ICD-10-CM

## 2013-08-28 DIAGNOSIS — R1013 Epigastric pain: Secondary | ICD-10-CM

## 2013-08-28 LAB — POCT CBC
Granulocyte percent: 58.4 %G (ref 37–80)
HCT, POC: 41.2 % (ref 37.7–47.9)
Hemoglobin: 12.9 g/dL (ref 12.2–16.2)
MCV: 92.9 fL (ref 80–97)
Platelet Count, POC: 180 10*3/uL (ref 142–424)
RBC: 4.44 M/uL (ref 4.04–5.48)
WBC: 6.1 10*3/uL (ref 4.6–10.2)

## 2013-08-28 LAB — IFOBT (OCCULT BLOOD): IFOBT: NEGATIVE

## 2013-08-28 NOTE — Patient Instructions (Signed)
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
## 2013-08-30 ENCOUNTER — Ambulatory Visit (INDEPENDENT_AMBULATORY_CARE_PROVIDER_SITE_OTHER): Payer: BC Managed Care – PPO | Admitting: Family Medicine

## 2013-08-30 VITALS — BP 102/52 | HR 98 | Temp 98.7°F | Resp 16 | Ht 65.0 in | Wt 166.0 lb

## 2013-08-30 DIAGNOSIS — N898 Other specified noninflammatory disorders of vagina: Secondary | ICD-10-CM

## 2013-08-30 DIAGNOSIS — K5900 Constipation, unspecified: Secondary | ICD-10-CM

## 2013-08-30 DIAGNOSIS — R300 Dysuria: Secondary | ICD-10-CM

## 2013-08-30 LAB — POCT WET PREP WITH KOH
Epithelial Wet Prep HPF POC: NEGATIVE
KOH Prep POC: NEGATIVE
Trichomonas, UA: NEGATIVE
Yeast Wet Prep HPF POC: NEGATIVE

## 2013-08-30 LAB — POCT UA - MICROSCOPIC ONLY
Mucus, UA: NEGATIVE
RBC, urine, microscopic: NEGATIVE
Yeast, UA: NEGATIVE

## 2013-08-30 LAB — POCT URINALYSIS DIPSTICK
Bilirubin, UA: NEGATIVE
Blood, UA: NEGATIVE
Ketones, UA: NEGATIVE
Nitrite, UA: NEGATIVE

## 2013-08-30 MED ORDER — CIPROFLOXACIN HCL 250 MG PO TABS: 250.0000 mg | ORAL_TABLET | Freq: Two times a day (BID) | ORAL | Status: DC

## 2013-08-30 MED ORDER — METRONIDAZOLE 500 MG PO TABS: 500.0000 mg | ORAL_TABLET | Freq: Three times a day (TID) | ORAL | Status: DC

## 2013-08-30 NOTE — Progress Notes (Signed)
   Subjective:    Patient ID: Brandy Patterson, female    DOB: [birth year], 22 y.o.   MRN: [id number]  HPI uti symptoms Normal discharge with foul odor last couple of days Began last night  No fever no nausea no hematuria hematochezia  Here for constipation two days ago Taking miralax with back pain and abd pain   Review of Systems     Objective:   Physical Exam NAD Moving normally Results for orders placed in visit on 08/30/13  POCT UA - MICROSCOPIC ONLY      Result Value Range   WBC, Ur, HPF, POC 3-6     RBC, urine, microscopic neg     Bacteria, U Microscopic trace     Mucus, UA neg     Epithelial cells, urine per micros 0-4     Crystals, Ur, HPF, POC neg     Casts, Ur, LPF, POC neg     Yeast, UA neg    POCT URINALYSIS DIPSTICK      Result Value Range   Color, UA yellow     Clarity, UA clear     Glucose, UA eng     Bilirubin, UA neg     Ketones, UA neg     Spec Grav, UA 1.015     Blood, UA neg     pH, UA 7.0     Protein, UA neg     Urobilinogen, UA 0.2     Nitrite, UA neg     Leukocytes, UA small (1+)    POCT WET PREP WITH KOH      Result Value Range   Trichomonas, UA Negative     Clue Cells Wet Prep HPF POC tntc     Epithelial Wet Prep HPF POC neg     Yeast Wet Prep HPF POC neg     Bacteria Wet Prep HPF POC 2+     RBC Wet Prep HPF POC 2-3     WBC Wet Prep HPF POC 2-9     KOH Prep POC Negative      No CVAT      Assessment & Plan:  Dysuria - Plan: POCT UA - Microscopic Only, POCT urinalysis dipstick, Urine culture, ciprofloxacin (CIPRO) 250 MG tablet  Vaginal discharge - Plan: POCT Wet Prep with KOH, metroNIDAZOLE (FLAGYL) 500 MG tablet  Signed, Elvina Sidle, MD

## 2013-08-30 NOTE — Patient Instructions (Signed)
Bacterial Vaginosis Bacterial vaginosis (BV) is a vaginal infection where the normal balance of bacteria in the vagina is disrupted. The normal balance is then replaced by an overgrowth of certain bacteria. There are several different kinds of bacteria that can cause BV. BV is the most common vaginal infection in women of childbearing age. CAUSES   The cause of BV is not fully understood. BV develops when there is an increase or imbalance of harmful bacteria.  Some activities or behaviors can upset the normal balance of bacteria in the vagina and put women at increased risk including:  Having a new sex partner or multiple sex partners.  Douching.  Using an intrauterine device (IUD) for contraception.  It is not clear what role sexual activity plays in the development of BV. However, women that have never had sexual intercourse are rarely infected with BV. Women do not get BV from toilet seats, bedding, swimming pools or from touching objects around them.  SYMPTOMS   Grey vaginal discharge.  A fish-like odor with discharge, especially after sexual intercourse.  Itching or burning of the vagina and vulva.  Burning or pain with urination.  Some women have no signs or symptoms at all. DIAGNOSIS  Your caregiver must examine the vagina for signs of BV. Your caregiver will perform lab tests and look at the sample of vaginal fluid through a microscope. They will look for bacteria and abnormal cells (clue cells), a pH test higher than 4.5, and a positive amine test all associated with BV.  RISKS AND COMPLICATIONS   Pelvic inflammatory disease (PID).  Infections following gynecology surgery.  Developing HIV.  Developing herpes virus. TREATMENT  Sometimes BV will clear up without treatment. However, all women with symptoms of BV should be treated to avoid complications, especially if gynecology surgery is planned. Female partners generally do not need to be treated. However, BV may spread  between female sex partners so treatment is helpful in preventing a recurrence of BV.   BV may be treated with antibiotics. The antibiotics come in either pill or vaginal cream forms. Either can be used with nonpregnant or pregnant women, but the recommended dosages differ. These antibiotics are not harmful to the baby.  BV can recur after treatment. If this happens, a second round of antibiotics will often be prescribed.  Treatment is important for pregnant women. If not treated, BV can cause a premature delivery, especially for a pregnant woman who had a premature birth in the past. All pregnant women who have symptoms of BV should be checked and treated.  For chronic reoccurrence of BV, treatment with a type of prescribed gel vaginally twice a week is helpful. HOME CARE INSTRUCTIONS   Finish all medication as directed by your caregiver.  Do not have sex until treatment is completed.  Tell your sexual partner that you have a vaginal infection. They should see their caregiver and be treated if they have problems, such as a mild rash or itching.  Practice safe sex. Use condoms. Only have 1 sex partner. PREVENTION  Basic prevention steps can help reduce the risk of upsetting the natural balance of bacteria in the vagina and developing BV:  Do not have sexual intercourse (be abstinent).  Do not douche.  Use all of the medicine prescribed for treatment of BV, even if the signs and symptoms go away.  Tell your sex partner if you have BV. That way, they can be treated, if needed, to prevent reoccurrence. SEEK MEDICAL CARE IF:     Your symptoms are not improving after 3 days of treatment.  You have increased discharge, pain, or fever. MAKE SURE YOU:   Understand these instructions.  Will watch your condition.  Will get help right away if you are not doing well or get worse. FOR MORE INFORMATION  Division of STD Prevention (DSTDP), Centers for Disease Control and Prevention:  www.cdc.gov/std American Social Health Association (ASHA): www.ashastd.org  Document Released: 09/08/2005 Document Revised: 12/01/2011 Document Reviewed: 04/20/2013 Exit[employer] Patient Information 2014 [employer].  

## 2013-09-04 LAB — URINE CULTURE: Colony Count: 35000

## 2013-09-10 ENCOUNTER — Ambulatory Visit (INDEPENDENT_AMBULATORY_CARE_PROVIDER_SITE_OTHER): Payer: BC Managed Care – PPO | Admitting: Physician Assistant

## 2013-09-10 VITALS — BP 116/58 | HR 81 | Temp 98.7°F | Resp 16 | Ht 65.0 in | Wt 165.4 lb

## 2013-09-10 DIAGNOSIS — H109 Unspecified conjunctivitis: Secondary | ICD-10-CM

## 2013-09-10 DIAGNOSIS — H1089 Other conjunctivitis: Secondary | ICD-10-CM

## 2013-09-10 MED ORDER — OFLOXACIN 0.3 % OP SOLN: 1.0000 [drp] | Freq: Four times a day (QID) | OPHTHALMIC | Status: DC

## 2013-09-10 NOTE — Progress Notes (Signed)
   Subjective:    Patient ID: Brandy Patterson, female    DOB: 06-15-1991, 22 y.o.   MRN: [id number]  HPI 22 year old female presents for evaluation of acute onset of left eye redness, irritation, and purulent drainage. States symptoms started suddenly today and have progressively worsened. Complains of thick, green, purulent drainage from left eye only. Complains of irritation and slight pruritis and pain.  Denies vision changes, headache, nausea, vomiting, dizziness, nasal congestion, otalgia, sore throat, fever, or chills. Patient does have a 15 month at home although she has not been sick with similar symptoms.  She is otherwise healthy with no other concerns today.     Review of Systems  Constitutional: Negative for fever and chills.  HENT: Negative for congestion, ear pain and sore throat.   Eyes: Positive for pain, discharge, redness and itching. Negative for photophobia and visual disturbance.  Respiratory: Negative for cough.   Gastrointestinal: Negative for nausea and vomiting.  Neurological: Negative for dizziness and headaches.       Objective:   Physical Exam  Constitutional: She is oriented to person, place, and time. She appears well-developed and well-nourished.  HENT:  Head: Normocephalic and atraumatic.  Right Ear: External ear normal.  Left Ear: External ear normal.  Mouth/Throat: Oropharynx is clear and moist.  Eyes: EOM are normal. Pupils are equal, round, and reactive to light. Right eye exhibits no discharge. Left eye exhibits discharge (copious, thick, green purulence). Left conjunctiva is injected.  Culture collected from left eye  Neck: Normal range of motion.  Cardiovascular: Normal rate.   Pulmonary/Chest: Effort normal.  Neurological: She is alert and oriented to person, place, and time.  Psychiatric: She has a normal mood and affect. Her behavior is normal. Judgment and thought content normal.          Assessment & Plan:  Bacterial conjunctivitis  - Plan: ofloxacin (OCUFLOX) 0.3 % ophthalmic solution, Wound culture  Start ocuflox as directed Warm compresses 3-4 times daily  RTC precautions Follow up as needed.

## 2013-09-10 NOTE — Patient Instructions (Signed)
Bacterial Conjunctivitis  Bacterial conjunctivitis, commonly called pink eye, is an inflammation of the clear membrane that covers the white part of the eye (conjunctiva). The inflammation can also happen on the underside of the eyelids. The blood vessels in the conjunctiva become inflamed causing the eye to become red or pink. Bacterial conjunctivitis may spread easily from one eye to another and from person to person (contagious).   CAUSES   Bacterial conjunctivitis is caused by bacteria. The bacteria may come from your own skin, your upper respiratory tract, or from someone else with bacterial conjunctivitis.  SYMPTOMS   The normally white color of the eye or the underside of the eyelid is usually pink or red. The pink eye is usually associated with irritation, tearing, and some sensitivity to light. Bacterial conjunctivitis is often associated with a thick, yellowish discharge from the eye. The discharge may turn into a crust on the eyelids overnight, which causes your eyelids to stick together. If a discharge is present, there may also be some blurred vision in the affected eye.  DIAGNOSIS   Bacterial conjunctivitis is diagnosed by your caregiver through an eye exam and the symptoms that you report. Your caregiver looks for changes in the surface tissues of your eyes, which may point to the specific type of conjunctivitis. A sample of any discharge may be collected on a cotton-tip swab if you have a severe case of conjunctivitis, if your cornea is affected, or if you keep getting repeat infections that do not respond to treatment. The sample will be sent to a lab to see if the inflammation is caused by a bacterial infection and to see if the infection will respond to antibiotic medicines.  TREATMENT   · Bacterial conjunctivitis is treated with antibiotics. Antibiotic eyedrops are most often used. However, antibiotic ointments are also available. Antibiotics pills are sometimes used. Artificial tears or eye  washes may ease discomfort.  HOME CARE INSTRUCTIONS   · To ease discomfort, apply a cool, clean wash cloth to your eye for 10 20 minutes, 3 4 times a day.  · Gently wipe away any drainage from your eye with a warm, wet washcloth or a cotton ball.  · Wash your hands often with soap and water. Use paper towels to dry your hands.  · Do not share towels or wash cloths. This may spread the infection.  · Change or wash your pillow case every day.  · You should not use eye makeup until the infection is gone.  · Do not operate machinery or drive if your vision is blurred.  · Stop using contacts lenses. Ask your caregiver how to sterilize or replace your contacts before using them again. This depends on the type of contact lenses that you use.  · When applying medicine to the infected eye, do not touch the edge of your eyelid with the eyedrop bottle or ointment tube.  SEEK IMMEDIATE MEDICAL CARE IF:   · Your infection has not improved within 3 days after beginning treatment.  · You had yellow discharge from your eye and it returns.  · You have increased eye pain.  · Your eye redness is spreading.  · Your vision becomes blurred.  · You have a fever or persistent symptoms for more than 2 3 days.  · You have a fever and your symptoms suddenly get worse.  · You have facial pain, redness, or swelling.  MAKE SURE YOU:   · Understand these instructions.  · Will watch your   condition.  · Will get help right away if you are not doing well or get worse.  Document Released: 09/08/2005 Document Revised: 06/02/2012 Document Reviewed: 02/09/2012  [employer]® Patient Information ©2014 [employer].

## 2013-09-13 LAB — WOUND CULTURE
Gram Stain: NONE SEEN
Gram Stain: NONE SEEN
Gram Stain: NONE SEEN
Organism ID, Bacteria: NO GROWTH

## 2013-10-23 ENCOUNTER — Ambulatory Visit (INDEPENDENT_AMBULATORY_CARE_PROVIDER_SITE_OTHER): Payer: PRIVATE HEALTH INSURANCE | Admitting: Internal Medicine

## 2013-10-23 VITALS — BP 104/68 | HR 72 | Temp 98.1°F | Resp 16 | Ht 65.0 in | Wt 160.4 lb

## 2013-10-23 DIAGNOSIS — R634 Abnormal weight loss: Secondary | ICD-10-CM

## 2013-10-23 DIAGNOSIS — O039 Complete or unspecified spontaneous abortion without complication: Secondary | ICD-10-CM

## 2013-10-23 DIAGNOSIS — R5381 Other malaise: Secondary | ICD-10-CM

## 2013-10-23 DIAGNOSIS — N920 Excessive and frequent menstruation with regular cycle: Secondary | ICD-10-CM

## 2013-10-23 DIAGNOSIS — R5383 Other fatigue: Secondary | ICD-10-CM

## 2013-10-23 LAB — POCT CBC
Granulocyte percent: 72.4 %G (ref 37–80)
HCT, POC: 41.5 % (ref 37.7–47.9)
Hemoglobin: 13.2 g/dL (ref 12.2–16.2)
LYMPH, POC: 1.6 (ref 0.6–3.4)
MCH: 29.8 pg (ref 27–31.2)
MCHC: 31.8 g/dL (ref 31.8–35.4)
MCV: 93.7 fL (ref 80–97)
MID (CBC): 0.4 (ref 0–0.9)
MPV: 8.7 fL (ref 0–99.8)
PLATELET COUNT, POC: 201 10*3/uL (ref 142–424)
POC Granulocyte: 5.1 (ref 2–6.9)
POC LYMPH %: 22.6 % (ref 10–50)
POC MID %: 5 % (ref 0–12)
RBC: 4.43 M/uL (ref 4.04–5.48)
RDW, POC: 12.9 %
WBC: 7.1 10*3/uL (ref 4.6–10.2)

## 2013-10-23 NOTE — Progress Notes (Signed)
Subjective:    Patient ID: Brandy Patterson, female    DOB: 06/15/1991, 23 y.o.   MRN: [id number] This chart was scribed for Ellamae Siaobert Dilpreet Faires, MD by Nicholos Johnsenise Iheanachor, Medical Scribe. This patient's care was started at 3:52 PM.  HPI HPI Comments: Brandy Patterson is a 23 y.o. female who presents to the [facility] to confirm a miscarriage. Pt received a positive pregnancy test 1 week ago. States she bled for 2 weeks straight, which has almost completely resolved as of 1 week ago. Has had a lot of HA and fatigue. Pt has had 2 miscarriages in the past long before a recent normal pregnancy and delivery. States sx of those miscarriages are the same as the ones she's been feeling now. States current period is ended but she has been spotting for the past couple of days-not today. Has been off of birth control since September; says periods have been irregular. Reports constant weight loss over the past couple of months-less than 10 lbs, but is about 20 lbs below prepreg weight.Currently has a 161.24 year old daughter; states she is 20 lbs below prepregnancy weight without trying. Pt does not know of a family hx of miscarriages, or clotting disorders. She is currently under great stress as she is going through the process of legalization for her husband in the [place] and states she needs to have a full medical record of herself on file to do so. Denies nausea. Denies breast tenderness. She has fatigue and daytime somnolence without insomnia.  Review of Systems  Constitutional: Negative for fever and chills.  Respiratory: Negative for chest tightness and shortness of breath.   Cardiovascular: Negative for chest pain, palpitations and leg swelling.  Gastrointestinal: Negative for abdominal pain.  Genitourinary: Negative for dysuria, frequency, difficulty urinating and pelvic pain.  Musculoskeletal: Negative for back pain.       Objective:   Physical Exam  Vitals reviewed. Constitutional:  She is oriented to person, place, and time. She appears well-developed and well-nourished. No distress.  HENT:  Head: Normocephalic and atraumatic.  Mouth/Throat: Oropharynx is clear and moist.  Eyes: Conjunctivae and EOM are normal. Pupils are equal, round, and reactive to light.  Neck: Neck supple. No thyromegaly present.  Cardiovascular: Normal rate, regular rhythm and normal heart sounds.  Exam reveals no gallop and no friction rub.   No murmur heard. Pulmonary/Chest: Effort normal. No respiratory distress.  Musculoskeletal: Normal range of motion.  Lymphadenopathy:    She has no cervical adenopathy.  Neurological: She is alert and oriented to person, place, and time.  Skin: Skin is warm and dry.  Psychiatric: She has a normal mood and affect. Her behavior is normal.   Results for orders placed in visit on 10/23/13  POCT CBC      Result Value Range   WBC 7.1  4.6 - 10.2 K/uL   Lymph, poc 1.6  0.6 - 3.4   POC LYMPH PERCENT 22.6  10 - 50 %L   MID (cbc) 0.4  0 - 0.9   POC MID % 5.0  0 - 12 %M   POC Granulocyte 5.1  2 - 6.9   Granulocyte percent 72.4  37 - 80 %G   RBC 4.43  4.04 - 5.48 M/uL   Hemoglobin 13.2  12.2 - 16.2 g/dL   HCT, POC 40.941.5  81.137.7 - 47.9 %   MCV 93.7  80 - 97 fL   MCH, POC 29.8  27 - 31.2 pg  MCHC 31.8  31.8 - 35.4 g/dL   RDW, POC 28.4     Platelet Count, POC 201  142 - 424 K/uL   MPV 8.7  0 - 99.8 fL   Assessment & Plan:  Excessive menses - Plan: POCT CBC  Spontaneous miscarriage -history of X 2 - Plan: hCG, quantitative, pregnancy  Other malaise and fatigue - Plan: Comprehensive metabolic panel, TSH, T4, free  Loss of weight - Plan: TSH, T4, free  Call results///?possible [facility](she indicates that her [facility] is the only covered [facility])     I have completed the patient encounter in its entirety as documented by the scribe, with editing by me where necessary. Mikaela Hilgeman P. Merla Riches, M.D.

## 2013-10-24 ENCOUNTER — Telehealth: Payer: Self-pay

## 2013-10-24 LAB — COMPREHENSIVE METABOLIC PANEL
ALBUMIN: 4.5 g/dL (ref 3.5–5.2)
ALK PHOS: 28 U/L — AB (ref 39–117)
ALT: 19 U/L (ref 0–35)
AST: 17 U/L (ref 0–37)
BILIRUBIN TOTAL: 1.5 mg/dL — AB (ref 0.2–1.2)
BUN: 14 mg/dL (ref 6–23)
CHLORIDE: 108 meq/L (ref 96–112)
CO2: 27 mEq/L (ref 19–32)
CREATININE: 0.71 mg/dL (ref 0.50–1.10)
Calcium: 9.8 mg/dL (ref 8.4–10.5)
Glucose, Bld: 84 mg/dL (ref 70–99)
Potassium: 4.8 mEq/L (ref 3.5–5.3)
Sodium: 143 mEq/L (ref 135–145)
Total Protein: 7.1 g/dL (ref 6.0–8.3)

## 2013-10-24 LAB — T4, FREE: FREE T4: 1.1 ng/dL (ref 0.80–1.80)

## 2013-10-24 LAB — TSH: TSH: 0.971 u[IU]/mL (ref 0.350–4.500)

## 2013-10-24 LAB — HCG, QUANTITATIVE, PREGNANCY: hCG, Beta Chain, Quant, S: 2 m[IU]/mL

## 2013-10-24 NOTE — Telephone Encounter (Signed)
Patient calling to find out results of blood tests from yesterday.  [phone]

## 2013-10-25 NOTE — Telephone Encounter (Signed)
Patient notified and voiced understanding. See lab result note

## 2013-11-07 ENCOUNTER — Ambulatory Visit (INDEPENDENT_AMBULATORY_CARE_PROVIDER_SITE_OTHER): Payer: PRIVATE HEALTH INSURANCE | Admitting: Emergency Medicine

## 2013-11-07 VITALS — BP 110/82 | HR 82 | Temp 98.3°F | Resp 18 | Ht 65.0 in | Wt 159.2 lb

## 2013-11-07 DIAGNOSIS — B9689 Other specified bacterial agents as the cause of diseases classified elsewhere: Secondary | ICD-10-CM

## 2013-11-07 DIAGNOSIS — A499 Bacterial infection, unspecified: Secondary | ICD-10-CM

## 2013-11-07 DIAGNOSIS — N898 Other specified noninflammatory disorders of vagina: Secondary | ICD-10-CM

## 2013-11-07 DIAGNOSIS — N760 Acute vaginitis: Secondary | ICD-10-CM

## 2013-11-07 LAB — POCT WET PREP WITH KOH
KOH Prep POC: NEGATIVE
Trichomonas, UA: NEGATIVE
Yeast Wet Prep HPF POC: NEGATIVE

## 2013-11-07 MED ORDER — METRONIDAZOLE 500 MG PO TABS: 500.0000 mg | ORAL_TABLET | Freq: Two times a day (BID) | ORAL | Status: DC

## 2013-11-07 MED ORDER — NORETHINDRONE ACET-ETHINYL EST 1.5-30 MG-MCG PO TABS: 1.0000 | ORAL_TABLET | Freq: Every day | ORAL | Status: DC

## 2013-11-07 NOTE — Addendum Note (Signed)
Addended by: Carmelina DaneANDERSON, Orestes Geiman S on: 11/07/2013 04:57 PM   Modules accepted: Orders
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
## 2013-11-07 NOTE — Patient Instructions (Signed)

## 2013-11-29 ENCOUNTER — Telehealth: Payer: Self-pay

## 2013-11-29 NOTE — Telephone Encounter (Signed)
Pt says she was on Loestrin several years ago and they said that they had to "up the hormone dosing" due to her consistent spotting. Is there anything else we can try her on or should she RTC. Please advise.

## 2013-11-29 NOTE — Telephone Encounter (Signed)
Dr Dareen PianoAnderson  Patient is asking for her birth control meds to be increased to the highest amount available due  To spotting.  [phone]

## 2013-11-29 NOTE — Telephone Encounter (Signed)
There is no higher dosage of Loestrin.  We could try a different birth control pill, but as stated in previous message, with any form of contraception there is risk of abnormal bleeding, spotting for the first several months.  This typically resolves after 3 months.  My first recommendation would be to continue taking the loestrin and see if the spotting does not resolve, but if she would like to try a different pill I can send one in.    Again, as stated in previous message, there can be other causes of abnormal bleeding including STI.  If pt is at risk would recommend she come in for testing

## 2013-11-29 NOTE — Telephone Encounter (Signed)
Pt states that she was on Loestrin before and needs the higher dosage. She is having spotting and cramping. She was just in office last month and saw Dr. Dareen PianoAnderson. Please advise.

## 2013-11-29 NOTE — Telephone Encounter (Signed)
Please remember to 'take' the message. If she just started the pills last month I would recommend that she give her body a chance to adjust to the hormones, it can take the body about 3 months to adjust to the hormones and irregular bleeding during this time can be normal.  If she was on them when she was seen did he change the Rx?  If he did what was she on and did she have problems with that one?  I would advise the patient that another cause of irregular bleeding can be gonorrhea and chlamydia and if she is at risk she should be tested.  If she has a lot of questions - an OV might be best.

## 2013-11-30 NOTE — Telephone Encounter (Signed)
Left message on machine [phone]) to call back .

## 2013-12-02 NOTE — Telephone Encounter (Signed)
[place] There was a gap in her birth control. She will give her body time to adjust and RTC if bleeding persist. She states she was recently tested for STI and all resulted negative.

## 2014-03-29 ENCOUNTER — Ambulatory Visit: Payer: Medicaid Other | Attending: Obstetrics and Gynecology | Admitting: Physical Therapy

## 2014-06-26 ENCOUNTER — Encounter (HOSPITAL_COMMUNITY): Payer: Self-pay | Admitting: General Practice

## 2014-06-26 ENCOUNTER — Inpatient Hospital Stay (HOSPITAL_COMMUNITY)
Admission: AD | Admit: 2014-06-26 | Discharge: 2014-06-26 | Disposition: A | Discharge status: Home / Self Care | Payer: Medicaid Other | Source: Ambulatory Visit | Attending: Obstetrics & Gynecology | Admitting: Obstetrics & Gynecology

## 2014-06-26 DIAGNOSIS — O0932 Supervision of pregnancy with insufficient antenatal care, second trimester: Secondary | ICD-10-CM | POA: Diagnosis not present

## 2014-06-26 DIAGNOSIS — O360920 Maternal care for other rhesus isoimmunization, second trimester, not applicable or unspecified: Secondary | ICD-10-CM | POA: Insufficient documentation

## 2014-06-26 DIAGNOSIS — O26852 Spotting complicating pregnancy, second trimester: Secondary | ICD-10-CM | POA: Insufficient documentation

## 2014-06-26 DIAGNOSIS — O26899 Other specified pregnancy related conditions, unspecified trimester: Secondary | ICD-10-CM

## 2014-06-26 DIAGNOSIS — R109 Unspecified abdominal pain: Secondary | ICD-10-CM | POA: Diagnosis present

## 2014-06-26 DIAGNOSIS — Z3A18 18 weeks gestation of pregnancy: Secondary | ICD-10-CM | POA: Insufficient documentation

## 2014-06-26 LAB — URINALYSIS, ROUTINE W REFLEX MICROSCOPIC
Bilirubin Urine: NEGATIVE
Glucose, UA: NEGATIVE mg/dL
HGB URINE DIPSTICK: NEGATIVE
KETONES UR: 15 mg/dL — AB
Leukocytes, UA: NEGATIVE
Nitrite: NEGATIVE
PROTEIN: NEGATIVE mg/dL
Specific Gravity, Urine: 1.02 (ref 1.005–1.030)
UROBILINOGEN UA: 0.2 mg/dL (ref 0.0–1.0)
pH: 6 (ref 5.0–8.0)

## 2014-06-26 LAB — WET PREP, GENITAL
Clue Cells Wet Prep HPF POC: NONE SEEN
TRICH WET PREP: NONE SEEN
YEAST WET PREP: NONE SEEN

## 2014-06-26 LAB — RAPID URINE DRUG SCREEN, HOSP PERFORMED
Amphetamines: NOT DETECTED
Barbiturates: NOT DETECTED
Benzodiazepines: NOT DETECTED
Cocaine: NOT DETECTED
OPIATES: NOT DETECTED
TETRAHYDROCANNABINOL: NOT DETECTED

## 2014-06-26 MED ORDER — RHO D IMMUNE GLOBULIN 1500 UNIT/2ML IJ SOSY
300.0000 ug | PREFILLED_SYRINGE | INTRAMUSCULAR | Status: AC
  Administered 2014-06-26: 300 ug via INTRAMUSCULAR
  Filled 2014-06-26: qty 2

## 2014-06-26 MED ORDER — RHO D IMMUNE GLOBULIN 1500 UNIT/2ML IJ SOSY
300.0000 ug | PREFILLED_SYRINGE | Freq: Once | INTRAMUSCULAR | Status: DC
  Filled 2014-06-26: qty 2

## 2014-06-26 NOTE — MAU Provider Note (Signed)
History     CSN: [id number]  Arrival date and time: 06/26/14 1802   None     No chief complaint on file.  HPI 23 [id number] @ 18+2 by unsure LMP, no prenatal care yet this pregnancy, history of Rh negative, presents with cramping and vaginal spotting.  Symptoms began with cramping 10/2 in the evening. Similar to menstrual cramps. Described as similar to prior miscarriage cramping. Noticed spotting morning of 10/3. Continues to have mild cramping. Spotting is now almost resolved. Recent URI, otherwise feeling well. No nausea/vomiting. Did have vaginal intercourse approximately 1 day prior to start of bleeding.  Describes history 3 second-trimester spontaneous abortions.  OB History   Grav Para Term Preterm Abortions TAB SAB Ect Mult Living   3 1 1  0 2     1      Past Medical History  Diagnosis Date  . UTI (lower urinary tract infection)   . Bacterial vaginosis   . Rh negative status during pregnancy   . Anxiety   . Abnormal Pap smear   . Endocrinopathy   . Bartholin's gland abscess 09/2012    Right  side;  I & D with Word Catheter Placement    Past Surgical History  Procedure Laterality Date  . Hydradenitis excision  2010    groin  . Mouth surgery    . Wisdom tooth extraction      Family History  Problem Relation Age of Onset  . Diabetes Father   . Hypertension Father   . Thyroid disease Father   . Hypertension Maternal Grandmother   . Diabetes Maternal Grandmother   . Hypertension Maternal Grandfather   . Diabetes Maternal Grandfather   . Cancer Maternal Grandfather   . Anemia Neg Hx   . Migraines Mother   . Aneurysm Mother 6    2006; cerebral aneurysm  . Seizures Sister     during childhood  . Syncope episode Sister     adolescence    History  Substance Use Topics  . Smoking status: Never Smoker   . Smokeless tobacco: Never Used  . Alcohol Use: No    Allergies:  Allergies  Allergen Reactions  . Cephalexin Hives  . Doxycycline Hives  .  Erythromycin Hives  . Vicodin [Hydrocodone-Acetaminophen]     No prescriptions prior to admission    Review of Systems  Constitutional: Negative for fever and chills.  Respiratory: Negative for cough.   Cardiovascular: Negative for chest pain and palpitations.  Gastrointestinal: Positive for abdominal pain. Negative for nausea, vomiting, diarrhea and constipation.  Genitourinary: Negative for dysuria, urgency, frequency, hematuria and flank pain.  Skin: Negative for rash.  Neurological: Negative for dizziness and headaches.  Psychiatric/Behavioral: Negative for depression.   Physical Exam   There were no vitals taken for this visit.  Physical Exam  Constitutional: She appears well-developed and well-nourished.  HENT:  Head: Normocephalic and atraumatic.  Eyes: Conjunctivae are normal.  Neck: Normal range of motion.  Cardiovascular: Normal rate, regular rhythm and normal heart sounds.   Respiratory: Effort normal and breath sounds normal.  GI: Soft. She exhibits no distension. There is no tenderness.  Uterus palpable ~2 cm inferior to the umbilicus  Genitourinary:  External genitalia wnl, small ectropion at cervix, no cervical bleeding, no blood in vault, scant thin white discharge, no CMT, no adnexal tenderness, no uterine tenderness. Cervix fingertip, thick, high  Neurological: She is alert.  Skin: Skin is warm.    MAU Course  Procedures  Results for  orders placed during the hospital encounter of 06/26/14 (from the past 24 hour(s))  URINALYSIS, ROUTINE W REFLEX MICROSCOPIC     Status: Abnormal   Collection Time    06/26/14  7:30 PM      Result Value Ref Range   Color, Urine YELLOW  YELLOW   APPearance CLEAR  CLEAR   Specific Gravity, Urine 1.020  1.005 - 1.030   pH 6.0  5.0 - 8.0   Glucose, UA NEGATIVE  NEGATIVE mg/dL   Hgb urine dipstick NEGATIVE  NEGATIVE   Bilirubin Urine NEGATIVE  NEGATIVE   Ketones, ur 15 (*) NEGATIVE mg/dL   Protein, ur NEGATIVE  NEGATIVE  mg/dL   Urobilinogen, UA 0.2  0.0 - 1.0 mg/dL   Nitrite NEGATIVE  NEGATIVE   Leukocytes, UA NEGATIVE  NEGATIVE  WET PREP, GENITAL     Status: Abnormal   Collection Time    06/26/14  7:52 PM      Result Value Ref Range   Yeast Wet Prep HPF POC NONE SEEN  NONE SEEN   Trich, Wet Prep NONE SEEN  NONE SEEN   Clue Cells Wet Prep HPF POC NONE SEEN  NONE SEEN   WBC, Wet Prep HPF POC FEW (*) NONE SEEN   MDM: +fetal heart tones    Assessment and Plan  23 [id number] @ 18+2 by unsure LMP, no prenatal care yet this pregnancy, history of Rh negative, presents with mild second trimester spotting. Normal fetal heart tones on transabdominal doppler, effectively ruling-out ectopic. No signs active bleeding from os or other site.  - will give Rhogam 300 units IM as is 48 hours from start of bleeding - we are scheduling fetal ultrasound for outpatient will discharge patient with strict bleeding return precautions and pelvic rest until placenta/vasa previa is ruled-out. Threatened miscarriage precautions also provided - f/u gonorrhea/chlamydia/HIV as well as urine drug screen - strongly encouraged to establish care ASAP. Is a candidate for 17-P and w/u of recurrent second-trimester spontaneous abortion is likely indicated   WOHLERT, NOAH 06/26/2014, 7:18 PM   Evaluation and management procedures were performed by the resident under my supervision and collaboration. I have reviewed the note and chart, and I agree with the management and plan.  Iona HansenJennifer Irene Rasch, NP 06/26/2014 8:31 PM  Rhogam 300 units given IM prior to D/C. Bleeding precautions reviewed Ultrasound to contact pt to schedule anatomy scan, pt given phone number to call U/S as needed. Return to MAU as needed for emergencies  Sharen CounterLisa Leftwich-Kirby Certified Nurse-Midwife

## 2014-06-26 NOTE — Discharge Instructions (Signed)

## 2014-06-26 NOTE — MAU Note (Signed)
Pt presents to MAU with c/o vaginal bleeding over the weekend but light pink spotting today.  Pt states she had brownish spotting for 3 days prior to today.

## 2014-06-27 LAB — GC/CHLAMYDIA PROBE AMP
CT Probe RNA: NEGATIVE
GC Probe RNA: NEGATIVE

## 2014-06-27 LAB — HIV ANTIBODY (ROUTINE TESTING W REFLEX): HIV 1&2 Ab, 4th Generation: NONREACTIVE

## 2014-06-28 LAB — RH IG WORKUP (INCLUDES ABO/RH)
ABO/RH(D): A NEG
Antibody Screen: NEGATIVE
GESTATIONAL AGE(WKS): 18
UNIT DIVISION: 0

## 2014-06-28 NOTE — MAU Provider Note (Signed)
Attestation of Attending Supervision of Advanced Practitioner (CNM/NP): Evaluation and management procedures were performed by the Advanced Practitioner under my supervision and collaboration. I have reviewed the Advanced Practitioner's note and chart, and I agree with the management and plan.  Dejay Kronk H. 11:01 AM

## 2014-07-05 ENCOUNTER — Other Ambulatory Visit (HOSPITAL_COMMUNITY): Payer: Self-pay | Admitting: Obstetrics and Gynecology

## 2014-07-05 ENCOUNTER — Ambulatory Visit (HOSPITAL_COMMUNITY)
Admission: RE | Admit: 2014-07-05 | Discharge: 2014-07-05 | Disposition: A | Discharge status: Home / Self Care | Payer: Medicaid Other | Source: Ambulatory Visit | Attending: Obstetrics and Gynecology | Admitting: Obstetrics and Gynecology

## 2014-07-05 DIAGNOSIS — Z3A19 19 weeks gestation of pregnancy: Secondary | ICD-10-CM | POA: Diagnosis not present

## 2014-07-05 DIAGNOSIS — Z3689 Encounter for other specified antenatal screening: Secondary | ICD-10-CM

## 2014-07-05 DIAGNOSIS — Z36 Encounter for antenatal screening of mother: Secondary | ICD-10-CM | POA: Diagnosis not present

## 2014-07-05 DIAGNOSIS — O26899 Other specified pregnancy related conditions, unspecified trimester: Secondary | ICD-10-CM

## 2014-07-05 DIAGNOSIS — O0932 Supervision of pregnancy with insufficient antenatal care, second trimester: Secondary | ICD-10-CM | POA: Diagnosis not present

## 2014-07-05 DIAGNOSIS — R109 Unspecified abdominal pain: Secondary | ICD-10-CM

## 2014-07-05 DIAGNOSIS — O9989 Other specified diseases and conditions complicating pregnancy, childbirth and the puerperium: Secondary | ICD-10-CM | POA: Diagnosis present

## 2014-07-11 LAB — OB RESULTS CONSOLE ABO/RH: RH TYPE: NEGATIVE

## 2014-07-11 LAB — OB RESULTS CONSOLE RPR: RPR: NONREACTIVE

## 2014-07-11 LAB — OB RESULTS CONSOLE HEPATITIS B SURFACE ANTIGEN: Hepatitis B Surface Ag: NEGATIVE

## 2014-07-24 ENCOUNTER — Encounter (HOSPITAL_COMMUNITY): Payer: Self-pay | Admitting: General Practice

## 2014-08-31 LAB — OB RESULTS CONSOLE RPR: RPR: NONREACTIVE

## 2014-08-31 LAB — OB RESULTS CONSOLE HIV ANTIBODY (ROUTINE TESTING): HIV: NONREACTIVE

## 2014-09-22 NOTE — L&D Delivery Note (Signed)
1159: Patient en route to room and reporting urge to push.  Remained at patient side, transferred from gurney to labor bed.  Infant noted to be +4 station.  Room prepped.  Patient encouraged to push with contractions.  FHR doppler reassuring.  Patient delivered as below with staff and family support.   Delivery Note At 12:16 PM, on November 21, 2014, a viable female "Brandy Patterson" was delivered via Vaginal, Spontaneous Delivery (Presentation:Occiput Anterior). Shoulders delivered easily and infant with good tone and spontaneous cry. Tactile stimulation given by provider and infant placed on mother's abdomen where nurse continued tactile stimulation. Infant APGAR: 9, 9. Cord clamped, cut, and blood collected. Placenta delivered spontaneously and noted to have trailing membranes that was notably attached to lower uterine segment.  Membranes clamped with ring forceps and pitocin started, which resulted in dislodgement 3 minutes later. Placenta then inspected and noted to be intact with 3VC upon inspection.  Vaginal inspection revealed no lacerations.  Fundus deep, but firm, at U/-1, and bleeding small.  Mother hemodynamically stable and infant skin to skin prior to provider exit.  Mother unsure of birth control method and opts to breastfeed.  Infant weight at one hour of life: 7lbs 7.8oz, 20in.  Family does not desire infant to be circumcised.  Anesthesia: None  Episiotomy: None Lacerations:  None Suture Repair: None Est. Blood Loss (mL):  400  Mom to postpartum.  Baby to Couplet care / Skin to Skin.  Ether Goebel LYNN MSN, CNM 11/21/2014, 1:08 PM

## 2014-10-31 LAB — OB RESULTS CONSOLE GBS: STREP GROUP B AG: NEGATIVE

## 2014-10-31 LAB — OB RESULTS CONSOLE GC/CHLAMYDIA
Chlamydia: NEGATIVE
Gonorrhea: NEGATIVE

## 2014-11-21 ENCOUNTER — Encounter (HOSPITAL_COMMUNITY): Payer: Self-pay | Admitting: *Deleted

## 2014-11-21 ENCOUNTER — Inpatient Hospital Stay (HOSPITAL_COMMUNITY)
Admission: AD | Admit: 2014-11-21 | Discharge: 2014-11-23 | DRG: 775 | Disposition: A | Discharge status: Home / Self Care | Payer: Medicaid Other | Source: Ambulatory Visit | Attending: Obstetrics and Gynecology | Admitting: Obstetrics and Gynecology

## 2014-11-21 DIAGNOSIS — IMO0001 Reserved for inherently not codable concepts without codable children: Secondary | ICD-10-CM

## 2014-11-21 DIAGNOSIS — Z3A39 39 weeks gestation of pregnancy: Secondary | ICD-10-CM | POA: Diagnosis present

## 2014-11-21 DIAGNOSIS — Z3483 Encounter for supervision of other normal pregnancy, third trimester: Secondary | ICD-10-CM | POA: Diagnosis present

## 2014-11-21 LAB — CBC
HCT: 35.5 % — ABNORMAL LOW (ref 36.0–46.0)
Hemoglobin: 11.8 g/dL — ABNORMAL LOW (ref 12.0–15.0)
MCH: 29.7 pg (ref 26.0–34.0)
MCHC: 33.2 g/dL (ref 30.0–36.0)
MCV: 89.4 fL (ref 78.0–100.0)
Platelets: 152 10*3/uL (ref 150–400)
RBC: 3.97 MIL/uL (ref 3.87–5.11)
RDW: 13.5 % (ref 11.5–15.5)
WBC: 9.9 10*3/uL (ref 4.0–10.5)

## 2014-11-21 LAB — POCT FERN TEST: POCT FERN TEST: POSITIVE

## 2014-11-21 MED ORDER — DIBUCAINE 1 % RE OINT: 1.0000 "application " | TOPICAL_OINTMENT | RECTAL | Status: DC | PRN

## 2014-11-21 MED ORDER — FENTANYL CITRATE 0.05 MG/ML IJ SOLN
100.0000 ug | Freq: Once | INTRAMUSCULAR | Status: AC
  Administered 2014-11-21: 100 ug via INTRAVENOUS

## 2014-11-21 MED ORDER — BENZOCAINE-MENTHOL 20-0.5 % EX AERO
1.0000 "application " | INHALATION_SPRAY | CUTANEOUS | Status: DC | PRN
  Administered 2014-11-21: 1 via TOPICAL
  Filled 2014-11-21: qty 56

## 2014-11-21 MED ORDER — WITCH HAZEL-GLYCERIN EX PADS: 1.0000 "application " | MEDICATED_PAD | CUTANEOUS | Status: DC | PRN

## 2014-11-21 MED ORDER — OXYTOCIN BOLUS FROM INFUSION
500.0000 mL | INTRAVENOUS | Status: DC
  Administered 2014-11-21: 500 mL via INTRAVENOUS

## 2014-11-21 MED ORDER — LACTATED RINGERS IV SOLN
500.0000 mL | INTRAVENOUS | Status: DC | PRN
  Administered 2014-11-21: 1000 mL via INTRAVENOUS

## 2014-11-21 MED ORDER — CITRIC ACID-SODIUM CITRATE 334-500 MG/5ML PO SOLN: 30.0000 mL | ORAL | Status: DC | PRN

## 2014-11-21 MED ORDER — LANOLIN HYDROUS EX OINT
TOPICAL_OINTMENT | CUTANEOUS | Status: DC | PRN
Start: 2014-11-21 — End: 2014-11-23

## 2014-11-21 MED ORDER — PRENATAL MULTIVITAMIN CH
1.0000 | ORAL_TABLET | Freq: Every day | ORAL | Status: DC
  Administered 2014-11-22 – 2014-11-23 (×2): 1 via ORAL
  Filled 2014-11-21 (×2): qty 1

## 2014-11-21 MED ORDER — SENNOSIDES-DOCUSATE SODIUM 8.6-50 MG PO TABS
2.0000 | ORAL_TABLET | ORAL | Status: DC
  Administered 2014-11-21: 2 via ORAL
  Filled 2014-11-21: qty 2

## 2014-11-21 MED ORDER — TRAMADOL HCL 50 MG PO TABS: 50.0000 mg | ORAL_TABLET | Freq: Four times a day (QID) | ORAL | Status: DC | PRN

## 2014-11-21 MED ORDER — ONDANSETRON HCL 4 MG/2ML IJ SOLN: 4.0000 mg | Freq: Four times a day (QID) | INTRAMUSCULAR | Status: DC | PRN

## 2014-11-21 MED ORDER — SIMETHICONE 80 MG PO CHEW
80.0000 mg | CHEWABLE_TABLET | ORAL | Status: DC | PRN
Start: 2014-11-21 — End: 2014-11-23

## 2014-11-21 MED ORDER — ZOLPIDEM TARTRATE 5 MG PO TABS: 5.0000 mg | ORAL_TABLET | Freq: Every evening | ORAL | Status: DC | PRN

## 2014-11-21 MED ORDER — ONDANSETRON HCL 4 MG PO TABS: 4.0000 mg | ORAL_TABLET | ORAL | Status: DC | PRN

## 2014-11-21 MED ORDER — TETANUS-DIPHTH-ACELL PERTUSSIS 5-2.5-18.5 LF-MCG/0.5 IM SUSP: 0.5000 mL | Freq: Once | INTRAMUSCULAR | Status: DC

## 2014-11-21 MED ORDER — ONDANSETRON HCL 4 MG/2ML IJ SOLN: 4.0000 mg | INTRAMUSCULAR | Status: DC | PRN

## 2014-11-21 MED ORDER — OXYTOCIN 40 UNITS IN LACTATED RINGERS INFUSION - SIMPLE MED: 62.5000 mL/h | INTRAVENOUS | Status: DC

## 2014-11-21 MED ORDER — IBUPROFEN 600 MG PO TABS
600.0000 mg | ORAL_TABLET | Freq: Four times a day (QID) | ORAL | Status: DC
  Administered 2014-11-21 – 2014-11-23 (×8): 600 mg via ORAL
  Filled 2014-11-21 (×8): qty 1

## 2014-11-21 MED ORDER — LACTATED RINGERS IV SOLN: INTRAVENOUS | Status: DC

## 2014-11-21 MED ORDER — FENTANYL CITRATE 0.05 MG/ML IJ SOLN
INTRAMUSCULAR | Status: AC
  Filled 2014-11-21: qty 2

## 2014-11-21 MED ORDER — DIPHENHYDRAMINE HCL 25 MG PO CAPS: 25.0000 mg | ORAL_CAPSULE | Freq: Four times a day (QID) | ORAL | Status: DC | PRN

## 2014-11-21 MED ORDER — LIDOCAINE HCL (PF) 1 % IJ SOLN
30.0000 mL | INTRAMUSCULAR | Status: DC | PRN
Start: 2014-11-21 — End: 2014-11-21
  Filled 2014-11-21: qty 30

## 2014-11-21 NOTE — Progress Notes (Signed)
[facility] CNM as to pt's admission and status. Aware was 3-4 yest at office and now very uncomfortable. Report srom at 0830.  Fld is alittle pink on pad

## 2014-11-21 NOTE — MAU Note (Signed)
Report called to [facility] Prichard RN [facility]. OK for pt to come to [phone]

## 2014-11-21 NOTE — MAU Note (Signed)
Water broke about 0830 and contractions started about same time. Cl fld.

## 2014-11-21 NOTE — MAU Note (Signed)
Pt states she began having uc's & leaking fluid @ 0830 this a.m., leaking continues, fluid is clear.  Has also had bloody mucus.  Was 3/90 in office yesterday.

## 2014-11-21 NOTE — Progress Notes (Signed)
Gerrit HeckJessica Emly CNM called in and updated as to pos fern and sve. [facility]

## 2014-11-21 NOTE — Lactation Note (Signed)
This note was copied from the chart of Brandy Delon SacramentoKhristina Cinc[place]. Lactation Consultation Note Mom has 2 yr. Old that she tried to BF but couldn't d/t a painful latch and tight frenulum. So she pumped and bottle fed for 2 months. Stated that she had blood in her milk and her nipples hurt so she stopped.  Mom states this baby doesn't hurt when BF and getting a deep latch. Mom states she hand expressed and saw colostrum. Mom encouraged to feed baby 8-12 times/24 hours and with feeding cues. Mom encouraged to waken baby for feeds. Mom encouraged to do skin-to-skin. Encouraged to call for assistance if needed and to verify proper latch. Educated about newborn behavior. Referred to Baby and Me Book in Breastfeeding section Pg. 22-23 for position options and Proper latch demonstration. [facility] brochure given w/resources, support groups and LC services.  Patient Name: Brandy Delon SacramentoKhristina Ivie ZOXWR'UToday's Patterson: 11/21/2014 Reason for consult: Initial assessment   Maternal Data Has patient been taught Hand Expression?: Yes  Feeding Feeding Type: Breast Fed Length of feed: 15 min  LATCH Score/Interventions Latch: Grasps breast easily, tongue down, lips flanged, rhythmical sucking.  Audible Swallowing: A few with stimulation  Type of Nipple: Everted at rest and after stimulation Intervention(s): No intervention needed  Comfort (Breast/Nipple): Soft / non-tender     Hold (Positioning): Assistance needed to correctly position infant at breast and maintain latch. Intervention(s): Breastfeeding basics reviewed;Support Pillows;Position options;Skin to skin  LATCH Score: 8  Lactation Tools Discussed/Used     Consult Status Consult Status: Follow-up Patterson: 11/22/14 Follow-up type: In-patient    Charyl DancerCARVER, Elya Diloreto G 11/21/2014, 6:16 PM

## 2014-11-21 NOTE — Progress Notes (Signed)
To BS via stretcher with pt on L side

## 2014-11-21 NOTE — Progress Notes (Signed)
Difficult to monitor on L side
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
## 2014-11-22 LAB — CBC
HCT: 28.6 % — ABNORMAL LOW (ref 36.0–46.0)
Hemoglobin: 9.4 g/dL — ABNORMAL LOW (ref 12.0–15.0)
MCH: 29.7 pg (ref 26.0–34.0)
MCHC: 32.9 g/dL (ref 30.0–36.0)
MCV: 90.2 fL (ref 78.0–100.0)
PLATELETS: 124 10*3/uL — AB (ref 150–400)
RBC: 3.17 MIL/uL — ABNORMAL LOW (ref 3.87–5.11)
RDW: 13.7 % (ref 11.5–15.5)
WBC: 11.2 10*3/uL — AB (ref 4.0–10.5)

## 2014-11-22 LAB — HIV ANTIBODY (ROUTINE TESTING W REFLEX): HIV SCREEN 4TH GENERATION: NONREACTIVE

## 2014-11-22 LAB — RPR: RPR: NONREACTIVE

## 2014-11-22 MED ORDER — RHO D IMMUNE GLOBULIN 1500 UNIT/2ML IJ SOSY
300.0000 ug | PREFILLED_SYRINGE | Freq: Once | INTRAMUSCULAR | Status: AC
  Administered 2014-11-22: 300 ug via INTRAMUSCULAR
  Filled 2014-11-22: qty 2

## 2014-11-22 NOTE — Progress Notes (Signed)
UR chart review completed.  

## 2014-11-22 NOTE — Progress Notes (Signed)
Subjective: Postpartum Day 1: Vaginal delivery, no lacerations Patient up ad lib, reports no syncope or dizziness. Feeding:  Breast Contraceptive plan:  Micronor Planning d/c tomorrow.  Using Motrin only with benefit.  No circumcision planned.  Objective: Vital signs in last 24 hours: Temp:  [97.7 F (36.5 C)-99.2 F (37.3 C)] 99.2 F (37.3 C) (03/02 0541) Pulse Rate:  [77-107] 92 (03/02 0541) Resp:  [18-20] 18 (03/02 0541) BP: (98-128)/(64-81) 115/67 mmHg (03/02 0541) SpO2:  [100 %] 100 % (03/02 0541) Weight:  [215 lb (97.523 kg)] 215 lb (97.523 kg) (03/01 1244)  Physical Exam:  General: alert Lochia: appropriate Uterine Fundus: firm Perineum: Perineum clear DVT Evaluation: No evidence of DVT seen on physical exam. Negative Homan's sign.    Recent Labs  11/21/14 1156 11/22/14 0551  HGB 11.8* 9.4*  HCT 35.5* 28.6*    Assessment/Plan: Status post vaginal delivery day 1. Stable Continue current care. Plan for discharge tomorrow    Nyra CapesLATHAM, VICKICNM 11/22/2014, 10:07 AM

## 2014-11-22 NOTE — Progress Notes (Signed)
Clinical Social Work Department BRIEF PSYCHOSOCIAL ASSESSMENT 11/22/2014  Patient:  Brandy Patterson, Brandy Patterson     Account Number:  [id number]     Admit date:  11/21/2014  Clinical Social Worker:  Loleta Books, CLINICAL SOCIAL WORKER  Date/Time:  11/22/2014 09:30 AM  Referred by:  RN  Date Referred:  11/21/2014  Referred for: History of anxiety       Interview type:  Patient  PSYCHOSOCIAL DATA Living Status:  FAMILY: FOB and daugther  Primary support relationship to patient:  SPOUSE Degree of support available:   MOB endorsed positive family support.   CURRENT CONCERNS Current Concerns  None Noted   SOCIAL WORK ASSESSMENT / PLAN CSW received referral for history of anxiety.  CSW completed chart review prior to meeting with MOB.  MOB was noted to have anxiety in June 2013.  CSW screened out referral for history of anxiety at time of daughter's birth on 05/26/2012 as MOB was prescribed medication PRN for anxiety.  CSW noted no documentation of current or ongoing anxiety during this pregnancy in the prenatal records.   MOB presented as easily engaged and receptive to the visit.  She displayed a full range in affect, was in a pleasant mood, no symptoms of anxiety observed or noted in her thought process.  She was observed to be interacting and bonding with the infant, and she smiled as she reflected upon her experiences while in L&D and her transition into the postpartum period.    MOB acknowledged history of anxiety. She presented with insight on reasons for past anxiety as she reflected upon 2 previous miscarriages and noting anxiety during her first pregnancy out of fear that she would miscarry again.  The MOB shared that the anxiety did not resolve when her daughter was born since she was overwhelmed as a first time parent, had gained a lot of weight (and felt bad about this), and felt isolated since she had 3 months of maternity leave.  She presented with self-awareness of how engaging in daily  self-care assisted her to reduce symptoms, and she also discussed how returning to work assisted her to have a "break" and engage in activities that she enjoyed.  MOB denied any symptoms of anxiety during this pregnancy.  She reported that she has felt physically "better", has not gained as much weight, and had a "fast" delivery when she believes was positive.  She discussed that the infant is sleeping and feeding well, and she has also noted that her daughter is transitioning well to being a big sister (but acknowledges that her daughter is also transitioning and may not always cope well everyday).  She shared that due to these factors, she does not have acute concerns about developing postpartum depression or anxiety.  MOB recognized that symptoms may occur as she continues to adjust to the infant's arrival, but she shared belief that she has learned various strategies that may assist her to cope with symptoms.   Assessment/plan status:  No Further Intervention Required Other assessment/ plan:   CSW provided education on postpartum depression.   Information/referral to community resources:   No referrals needed at this time as no needs were identified.   PATIENT'S/FAMILY'S RESPONSE TO PLAN OF CARE: MOB reported intention to engage in daily self-care activities, such as exercising, eating well, and "getting outside".  She shared belief that it will also be important for her to return to work when medically able since she believes all these factors have a positive impact on her mental health.  She expresesd intention to follow up with her providers if she notes onset of postpartum depression or anxiety.    MOB expressed appreciation for the visit, and denied current questions, concerns, or needs.  No further CSW intervention needed, but contact CSW as needs arise or upon MOB request.

## 2014-11-23 LAB — RH IG WORKUP (INCLUDES ABO/RH)
ABO/RH(D): A NEG
Fetal Screen: NEGATIVE
GESTATIONAL AGE(WKS): 38
Unit division: 0

## 2014-11-23 LAB — RUBELLA ANTIBODY, IGM: Rubella IgM: <20 AU/mL (ref 0.0–19.9)

## 2014-11-23 MED ORDER — NORETHINDRONE 0.35 MG PO TABS: 1.0000 | ORAL_TABLET | Freq: Every day | ORAL | Status: AC

## 2014-11-23 MED ORDER — FERROUS SULFATE 325 (65 FE) MG PO TABS: 325.0000 mg | ORAL_TABLET | Freq: Every day | ORAL | Status: AC

## 2014-11-23 MED ORDER — MEASLES, MUMPS & RUBELLA VAC ~~LOC~~ INJ
0.5000 mL | INJECTION | Freq: Once | SUBCUTANEOUS | Status: DC
  Filled 2014-11-23: qty 0.5

## 2014-11-23 MED ORDER — IBUPROFEN 600 MG PO TABS: 600.0000 mg | ORAL_TABLET | Freq: Four times a day (QID) | ORAL | Status: DC

## 2014-11-23 MED FILL — Oxytocin-Lactated Ringers IV Soln 40 Unit/1000ML: INTRAVENOUS | Qty: 1000 | Status: AC

## 2014-11-23 NOTE — Discharge Summary (Signed)
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 as well as vitamin and herbal supplements. It is possible to become pregnant while breastfeeding. If birth control is desired, ask your health care provider about options that will be safe for your baby. SEEK MEDICAL CARE IF:   You feel like you want to stop breastfeeding or have become frustrated with breastfeeding.  You have painful breasts or nipples.  Your nipples are cracked or bleeding.  Your breasts are red, tender, or warm.  You have a swollen area on either breast.  You have a fever or chills.  You have nausea or vomiting.  You have drainage other than breast milk from your nipples.  Your breasts do not become full before feedings by the fifth day after you give birth.  You feel sad and depressed.  Your baby is too sleepy to eat well.  Your baby is having trouble sleeping.   Your baby is  wetting less than 3 diapers in a 24-hour period.  Your baby has less than 3 stools in a 24-hour period.  Your baby's skin or the white part of his or her eyes becomes yellow.   Your baby is not gaining weight by 22 days of age. SEEK IMMEDIATE MEDICAL CARE IF:   Your baby is overly tired (lethargic) and does not want to wake up and feed.  Your baby develops an unexplained fever. Document Released: 09/08/2005 Document Revised: 09/13/2013 Document Reviewed: 03/02/2013 [facility] Patient Information 2015 [place], [place]. This information is not intended to replace advice given to you by your health care provider. Make sure you discuss any questions you have with your health care provider.

## 2014-11-23 NOTE — Lactation Note (Addendum)
This note was copied from the chart of Brandy Patterson Leighton. Lactation Consultation Note  Patient Name: Brandy Patterson Duell WUJWJ'XToday's Date: 11/23/2014 Reason for consult: Follow-up assessment;Other (Comment) (per mom plans to pump and bottle feed )  Per mom sore nipples and hurts when the baby is feeding. Mom was already given comfort gels. Dr. Margo AyeHall in the room examining baby and noted a short posterior frenulum , probably cause of moms discomfort. Per mom has decided to pump and bottle feed . LC discussed importance of consistent pumping every 2-3 hours  For 15 -20 mins . Also prevention of engorgement. Mom plans to buy a DEBP Medela today , because she has a  Evenflo pump at home. LC suggested feeding baby with a Dr. Manson PasseyBrown medium nipple or Medela . And increased the volume as needed .  By the end week should be taking at least 45 ml - 60 ml .  Mother informed of post-discharge support and given phone number to the lactation department, including services for  phone call assistance; out-patient appointments; and breastfeeding support group. List of other breastfeeding resources  in the community given in the handout. Encouraged mother to call for problems or concerns related to breastfeeding.   Maternal Data    Feeding Feeding Type: Bottle Fed - Breast Milk Nipple Type: Slow - flow  LATCH Score/Interventions                Intervention(s): Breastfeeding basics reviewed (see LC note )     Lactation Tools Discussed/Used WIC Program: Yes   Consult Status Consult Status: Complete Date: 11/23/14    Kathrin Greathouseorio, Fitz Matsuo Ann 11/23/2014, 12:17 PM

## 2014-11-25 LAB — TYPE AND SCREEN
ABO/RH(D): A NEG
Antibody Screen: POSITIVE
DAT, IgG: NEGATIVE
Unit division: 0
Unit division: 0

## 2014-12-22 ENCOUNTER — Encounter (HOSPITAL_BASED_OUTPATIENT_CLINIC_OR_DEPARTMENT_OTHER): Payer: Self-pay

## 2014-12-22 ENCOUNTER — Emergency Department (HOSPITAL_BASED_OUTPATIENT_CLINIC_OR_DEPARTMENT_OTHER)
Admission: EM | Admit: 2014-12-22 | Discharge: 2014-12-22 | Disposition: A | Discharge status: Home / Self Care | Payer: Medicaid Other | Attending: Emergency Medicine | Admitting: Emergency Medicine

## 2014-12-22 ENCOUNTER — Emergency Department (HOSPITAL_BASED_OUTPATIENT_CLINIC_OR_DEPARTMENT_OTHER): Payer: Medicaid Other

## 2014-12-22 DIAGNOSIS — Z3202 Encounter for pregnancy test, result negative: Secondary | ICD-10-CM | POA: Insufficient documentation

## 2014-12-22 DIAGNOSIS — Z79899 Other long term (current) drug therapy: Secondary | ICD-10-CM | POA: Insufficient documentation

## 2014-12-22 DIAGNOSIS — Z8639 Personal history of other endocrine, nutritional and metabolic disease: Secondary | ICD-10-CM | POA: Diagnosis not present

## 2014-12-22 DIAGNOSIS — Z87440 Personal history of urinary (tract) infections: Secondary | ICD-10-CM | POA: Insufficient documentation

## 2014-12-22 DIAGNOSIS — Z8742 Personal history of other diseases of the female genital tract: Secondary | ICD-10-CM | POA: Diagnosis not present

## 2014-12-22 DIAGNOSIS — R109 Unspecified abdominal pain: Secondary | ICD-10-CM | POA: Diagnosis present

## 2014-12-22 DIAGNOSIS — N200 Calculus of kidney: Secondary | ICD-10-CM | POA: Diagnosis not present

## 2014-12-22 DIAGNOSIS — Z8659 Personal history of other mental and behavioral disorders: Secondary | ICD-10-CM | POA: Insufficient documentation

## 2014-12-22 DIAGNOSIS — R112 Nausea with vomiting, unspecified: Secondary | ICD-10-CM

## 2014-12-22 DIAGNOSIS — R197 Diarrhea, unspecified: Secondary | ICD-10-CM | POA: Insufficient documentation

## 2014-12-22 LAB — CBC WITH DIFFERENTIAL/PLATELET
BASOS ABS: 0 10*3/uL (ref 0.0–0.1)
Basophils Relative: 0 % (ref 0–1)
EOS ABS: 0.2 10*3/uL (ref 0.0–0.7)
EOS PCT: 3 % (ref 0–5)
HCT: 37.5 % (ref 36.0–46.0)
Hemoglobin: 12.2 g/dL (ref 12.0–15.0)
LYMPHS PCT: 28 % (ref 12–46)
Lymphs Abs: 1.6 10*3/uL (ref 0.7–4.0)
MCH: 29 pg (ref 26.0–34.0)
MCHC: 32.5 g/dL (ref 30.0–36.0)
MCV: 89.1 fL (ref 78.0–100.0)
Monocytes Absolute: 0.5 10*3/uL (ref 0.1–1.0)
Monocytes Relative: 8 % (ref 3–12)
Neutro Abs: 3.4 10*3/uL (ref 1.7–7.7)
Neutrophils Relative %: 61 % (ref 43–77)
Platelets: 187 10*3/uL (ref 150–400)
RBC: 4.21 MIL/uL (ref 3.87–5.11)
RDW: 12.8 % (ref 11.5–15.5)
WBC: 5.7 10*3/uL (ref 4.0–10.5)

## 2014-12-22 LAB — URINE MICROSCOPIC-ADD ON

## 2014-12-22 LAB — URINALYSIS, ROUTINE W REFLEX MICROSCOPIC
Bilirubin Urine: NEGATIVE
GLUCOSE, UA: NEGATIVE mg/dL
Ketones, ur: 40 mg/dL — AB
Nitrite: NEGATIVE
Protein, ur: 30 mg/dL — AB
Specific Gravity, Urine: 1.039 — ABNORMAL HIGH (ref 1.005–1.030)
Urobilinogen, UA: 0.2 mg/dL (ref 0.0–1.0)
pH: 6.5 (ref 5.0–8.0)

## 2014-12-22 LAB — COMPREHENSIVE METABOLIC PANEL
ALK PHOS: 34 U/L — AB (ref 39–117)
ALT: 45 U/L — AB (ref 0–35)
AST: 34 U/L (ref 0–37)
Albumin: 4.6 g/dL (ref 3.5–5.2)
Anion gap: 9 (ref 5–15)
BUN: 20 mg/dL (ref 6–23)
CALCIUM: 9.1 mg/dL (ref 8.4–10.5)
CO2: 26 mmol/L (ref 19–32)
Chloride: 105 mmol/L (ref 96–112)
Creatinine, Ser: 0.85 mg/dL (ref 0.50–1.10)
GLUCOSE: 126 mg/dL — AB (ref 70–99)
Potassium: 4.1 mmol/L (ref 3.5–5.1)
SODIUM: 140 mmol/L (ref 135–145)
Total Bilirubin: 0.8 mg/dL (ref 0.3–1.2)
Total Protein: 7.4 g/dL (ref 6.0–8.3)

## 2014-12-22 LAB — PREGNANCY, URINE: Preg Test, Ur: NEGATIVE

## 2014-12-22 MED ORDER — KETOROLAC TROMETHAMINE 30 MG/ML IJ SOLN
30.0000 mg | Freq: Once | INTRAMUSCULAR | Status: AC
  Administered 2014-12-22: 30 mg via INTRAVENOUS
  Filled 2014-12-22: qty 1

## 2014-12-22 MED ORDER — FENTANYL CITRATE 0.05 MG/ML IJ SOLN
100.0000 ug | Freq: Once | INTRAMUSCULAR | Status: AC
  Administered 2014-12-22: 100 ug via INTRAVENOUS
  Filled 2014-12-22: qty 2

## 2014-12-22 MED ORDER — OXYCODONE-ACETAMINOPHEN 5-325 MG PO TABS: 1.0000 | ORAL_TABLET | ORAL | Status: DC | PRN

## 2014-12-22 MED ORDER — ONDANSETRON 4 MG PO TBDP: ORAL_TABLET | ORAL | Status: AC

## 2014-12-22 MED ORDER — FENTANYL CITRATE 0.05 MG/ML IJ SOLN
100.0000 ug | Freq: Once | INTRAMUSCULAR | Status: AC
Start: 2014-12-22 — End: 2014-12-22
  Administered 2014-12-22: 100 ug via INTRAVENOUS
  Filled 2014-12-22: qty 2

## 2014-12-22 MED ORDER — CIPROFLOXACIN HCL 500 MG PO TABS: 500.0000 mg | ORAL_TABLET | Freq: Two times a day (BID) | ORAL | Status: AC

## 2014-12-22 MED ORDER — SODIUM CHLORIDE 0.9 % IV BOLUS (SEPSIS)
1000.0000 mL | Freq: Once | INTRAVENOUS | Status: AC
  Administered 2014-12-22: 1000 mL via INTRAVENOUS

## 2014-12-22 MED ORDER — METOCLOPRAMIDE HCL 5 MG/ML IJ SOLN
10.0000 mg | Freq: Once | INTRAMUSCULAR | Status: AC
  Administered 2014-12-22: 10 mg via INTRAVENOUS
  Filled 2014-12-22: qty 2

## 2014-12-22 MED ORDER — ONDANSETRON HCL 4 MG/2ML IJ SOLN
4.0000 mg | Freq: Once | INTRAMUSCULAR | Status: AC
  Administered 2014-12-22: 4 mg via INTRAVENOUS
  Filled 2014-12-22: qty 2

## 2014-12-22 MED ORDER — DICYCLOMINE HCL 10 MG PO CAPS
10.0000 mg | ORAL_CAPSULE | Freq: Once | ORAL | Status: AC
  Administered 2014-12-22: 10 mg via ORAL
  Filled 2014-12-22: qty 1

## 2014-12-22 NOTE — Discharge Instructions (Signed)
Do not breastfeed on these medications. Kidney Stones Kidney stones (urolithiasis) are deposits that form inside your kidneys. The intense pain is caused by the stone moving through the urinary tract. When the stone moves, the ureter goes into spasm around the stone. The stone is usually passed in the urine.  CAUSES   A disorder that makes certain neck glands produce too much parathyroid hormone (primary hyperparathyroidism).  A buildup of uric acid crystals, similar to gout in your joints.  Narrowing (stricture) of the ureter.  A kidney obstruction present at birth (congenital obstruction).  Previous surgery on the kidney or ureters.  Numerous kidney infections. SYMPTOMS   Feeling sick to your stomach (nauseous).  Throwing up (vomiting).  Blood in the urine (hematuria).  Pain that usually spreads (radiates) to the groin.  Frequency or urgency of urination. DIAGNOSIS   Taking a history and physical exam.  Blood or urine tests.  CT scan.  Occasionally, an examination of the inside of the urinary bladder (cystoscopy) is performed. TREATMENT   Observation.  Increasing your fluid intake.  Extracorporeal shock wave lithotripsy--This is a noninvasive procedure that uses shock waves to break up kidney stones.  Surgery may be needed if you have severe pain or persistent obstruction. There are various surgical procedures. Most of the procedures are performed with the use of small instruments. Only small incisions are needed to accommodate these instruments, so recovery time is minimized. The size, location, and chemical composition are all important variables that will determine the proper choice of action for you. Talk to your health care provider to better understand your situation so that you will minimize the risk of injury to yourself and your kidney.  HOME CARE INSTRUCTIONS   Drink enough water and fluids to keep your urine clear or pale yellow. This will help you to pass  the stone or stone fragments.  Strain all urine through the provided strainer. Keep all particulate matter and stones for your health care provider to see. The stone causing the pain may be as small as a grain of salt. It is very important to use the strainer each and every time you pass your urine. The collection of your stone will allow your health care provider to analyze it and verify that a stone has actually passed. The stone analysis will often identify what you can do to reduce the incidence of recurrences.  Only take over-the-counter or prescription medicines for pain, discomfort, or fever as directed by your health care provider.  Make a follow-up appointment with your health care provider as directed.  Get follow-up X-rays if required. The absence of pain does not always mean that the stone has passed. It may have only stopped moving. If the urine remains completely obstructed, it can cause loss of kidney function or even complete destruction of the kidney. It is your responsibility to make sure X-rays and follow-ups are completed. Ultrasounds of the kidney can show blockages and the status of the kidney. Ultrasounds are not associated with any radiation and can be performed easily in a matter of minutes. SEEK MEDICAL CARE IF:  You experience pain that is progressive and unresponsive to any pain medicine you have been prescribed. SEEK IMMEDIATE MEDICAL CARE IF:   Pain cannot be controlled with the prescribed medicine.  You have a fever or shaking chills.  The severity or intensity of pain increases over 18 hours and is not relieved by pain medicine.  You develop a new onset of abdominal pain.  You feel faint or pass out.  You are unable to urinate. MAKE SURE YOU:   Understand these instructions.  Will watch your condition.  Will get help right away if you are not doing well or get worse. Document Released: 09/08/2005 Document Revised: 05/11/2013 Document Reviewed:  02/09/2013 [facility] Patient Information 2015 [place], [place]. This information is not intended to replace advice given to you by your health care provider. Make sure you discuss any questions you have with your health care provider.

## 2014-12-22 NOTE — ED Provider Notes (Signed)
Assumed care of pt from Dr. Micheline Mazeocherty pending CT scan of abd.  This returned with obstructive L nephrolithiasis.  Pain relieved with toradol and fentanyl.  DC home with abx, percocet, zofran.    Nephrolithiasis  Left flank pain - Plan: CT RENAL STONE STUDY, CT RENAL STONE STUDY    Mirian MoMatthew Gentry, MD 12/22/14 [phone]

## 2014-12-22 NOTE — ED Provider Notes (Signed)
CSN: [id number]     Arrival date & time 12/22/14  0456 History   First MD Initiated Contact with Patient 12/22/14 [phone]     Chief Complaint  Patient presents with  . Abdominal Cramping     (Consider location/radiation/quality/duration/timing/severity/associated sxs/prior Treatment) Patient is a 24 y.o. female presenting with cramps. The history is provided by the patient. No language interpreter was used.  Abdominal Cramping This is a new problem. The current episode started 1 to 2 hours ago. The problem occurs constantly. The problem has not changed since onset.Associated symptoms include abdominal pain. Pertinent negatives include no chest pain, no headaches and no shortness of breath. Associated symptoms comments: Nausea vomiting and diarrhea.  Pain rating to left flank. Nothing aggravates the symptoms. Nothing relieves the symptoms. She has tried nothing for the symptoms. The treatment provided no relief.    Past Medical History  Diagnosis Date  . UTI (lower urinary tract infection)   . Bacterial vaginosis   . Rh negative status during pregnancy   . Anxiety   . Abnormal Pap smear   . Endocrinopathy   . Bartholin's gland abscess 09/2012    Right  side;  I & D with Word Catheter Placement   Past Surgical History  Procedure Laterality Date  . Hydradenitis excision  2010    groin  . Mouth surgery    . Wisdom tooth extraction     Family History  Problem Relation Age of Onset  . Diabetes Father   . Hypertension Father   . Thyroid disease Father   . Hypertension Maternal Grandmother   . Diabetes Maternal Grandmother   . Hypertension Maternal Grandfather   . Diabetes Maternal Grandfather   . Cancer Maternal Grandfather   . Anemia Neg Hx   . Migraines Mother   . Aneurysm Mother 72    2006; cerebral aneurysm  . Seizures Sister     during childhood  . Syncope episode Sister     adolescence   History  Substance Use Topics  . Smoking status: Never Smoker   . Smokeless  tobacco: Never Used  . Alcohol Use: No   OB History    Gravida Para Term Preterm AB TAB SAB Ectopic Multiple Living   0 2    0 2     Review of Systems  Constitutional: Negative for fever, chills, diaphoresis, activity change, appetite change and fatigue.  HENT: Negative for congestion, facial swelling, rhinorrhea and sore throat.   Eyes: Negative for photophobia and discharge.  Respiratory: Negative for cough, chest tightness and shortness of breath.   Cardiovascular: Negative for chest pain, palpitations and leg swelling.  Gastrointestinal: Positive for abdominal pain. Negative for nausea, vomiting and diarrhea.  Endocrine: Negative for polydipsia and polyuria.  Genitourinary: Positive for vaginal discharge (scant brown, decreasing since vaginal delivery one month ago). Negative for dysuria, frequency, difficulty urinating and pelvic pain.  Musculoskeletal: Negative for back pain, arthralgias, neck pain and neck stiffness.  Skin: Negative for color change and wound.  Allergic/Immunologic: Negative for immunocompromised state.  Neurological: Negative for facial asymmetry, weakness, numbness and headaches.  Hematological: Does not bruise/bleed easily.  Psychiatric/Behavioral: Negative for confusion and agitation.      Allergies  Cephalexin; Doxycycline; Erythromycin; and Vicodin  Home Medications   Prior to Admission medications   Medication Sig Start Date End Date Taking? Authorizing Provider  cetirizine (ZYRTEC) 10 MG tablet Take 10 mg by mouth daily.    Historical Provider, MD  ciprofloxacin (CIPRO)  500 MG tablet Take 1 tablet (500 mg total) by mouth 2 (two) times daily. One po bid x 7 days 12/22/14   Mirian MoMatthew Gentry, MD  ferrous sulfate (FERROUSUL) 325 (65 FE) MG tablet Take 1 tablet (325 mg total) by mouth daily with breakfast. 11/23/14   Venus Standard, CNM  ibuprofen (ADVIL,MOTRIN) 600 MG tablet Take 1 tablet (600 mg total) by mouth every 6 (six) hours. 11/23/14   Venus  Standard, CNM  norethindrone (ORTHO MICRONOR) 0.35 MG tablet Take 1 tablet (0.35 mg total) by mouth daily. 11/23/14   Venus Standard, CNM  ondansetron (ZOFRAN ODT) 4 MG disintegrating tablet 4mg  ODT q4 hours prn nausea/vomit 12/22/14   Mirian MoMatthew Gentry, MD  oxyCODONE-acetaminophen (PERCOCET/ROXICET) 5-325 MG per tablet Take 1-2 tablets by mouth every 4 (four) hours as needed for severe pain. 12/22/14   Mirian MoMatthew Gentry, MD  Prenatal Vit-Fe Fumarate-FA (PRENATAL MULTIVITAMIN) TABS tablet Take 1 tablet by mouth daily at 12 noon.    Historical Provider, MD   BP 106/60 mmHg  Pulse 69  Temp(Src) 98.3 F (36.8 C) (Oral)  Resp 18  Ht 5\' 6"  (1.676 m)  Wt 189 lb (85.73 kg)  BMI 30.52 kg/m2  SpO2 100% Physical Exam  Constitutional: She is oriented to person, place, and time. She appears well-developed and well-nourished. No distress.  Uncomfortable appearing  HENT:  Head: Normocephalic and atraumatic.  Mouth/Throat: No oropharyngeal exudate.  Eyes: Pupils are equal, round, and reactive to light.  Neck: Normal range of motion. Neck supple.  Cardiovascular: Normal rate, regular rhythm and normal heart sounds.  Exam reveals no gallop and no friction rub.   No murmur heard. Pulmonary/Chest: Effort normal and breath sounds normal. No respiratory distress. She has no wheezes. She has no rales.  Abdominal: Soft. Bowel sounds are normal. She exhibits no distension and no mass. There is generalized tenderness. There is no rebound and no guarding.  Musculoskeletal: Normal range of motion. She exhibits no edema or tenderness.  Neurological: She is alert and oriented to person, place, and time.  Skin: Skin is warm and dry.  Psychiatric: She has a normal mood and affect.    ED Course  Procedures (including critical care time) Labs Review Labs Reviewed  COMPREHENSIVE METABOLIC PANEL - Abnormal; Notable for the following:    Glucose, Bld 126 (*)    ALT 45 (*)    Alkaline Phosphatase 34 (*)    All other  components within normal limits  URINALYSIS, ROUTINE W REFLEX MICROSCOPIC - Abnormal; Notable for the following:    APPearance CLOUDY (*)    Specific Gravity, Urine 1.039 (*)    Hgb urine dipstick SMALL (*)    Ketones, ur 40 (*)    Protein, ur 30 (*)    Leukocytes, UA SMALL (*)    All other components within normal limits  URINE MICROSCOPIC-ADD ON - Abnormal; Notable for the following:    Squamous Epithelial / LPF FEW (*)    Bacteria, UA FEW (*)    All other components within normal limits  URINE CULTURE  CBC WITH DIFFERENTIAL/PLATELET  PREGNANCY, URINE    Imaging Review Ct Renal Stone Study  12/22/2014   CLINICAL DATA:  Lower abdomen and flank pain/cramping  EXAM: CT ABDOMEN AND PELVIS WITHOUT CONTRAST  TECHNIQUE: Multidetector CT imaging of the abdomen and pelvis was performed following the standard protocol without oral or intravenous contrast material administration.  COMPARISON:  None.  FINDINGS: Lung bases are clear.  Liver is prominent, measuring 20.6 cm in  length. No focal liver lesions are identified on this noncontrast enhanced study. Gallbladder wall is not appreciably thickened. There is no biliary duct dilatation.  Spleen, pancreas, and adrenals appear normal. Right kidney shows no evidence of mass or hydronephrosis on either side. There is no renal or ureteral calculus on the right. On the left, there is no renal mass. There is, however, moderate hydronephrosis on the left. There is a 1 mm nonobstructing calculus in the lower pole left kidney region. There is a calculus in the distal left ureter measuring 4 x 4 mm. No other ureteral calculus is identified. There are phleboliths in the pelvis as well.  In the pelvis, the urinary bladder is partially decompressed. The wall of the urinary bladder is not thickened. There is no pelvic mass or pelvic fluid collection. The appendix appears normal.  There is no bowel obstruction. There is no free air or portal venous air. There is a rather  minimal ventral hernia containing only fat. There is no ascites, adenopathy, or abscess in the abdomen or pelvis. There is no abdominal aortic aneurysm. There are no blastic or lytic bone lesions. There is narrowing of each sacroiliac joint with sclerosis in the bone adjacent to each sacroiliac joint.  IMPRESSION: 4 mm calculus distal left ureter with moderate hydronephrosis on the left. 1 mm nonobstructing calculus lower pole left kidney.  No bowel obstruction.  No abscess.  Appendix appears normal.  Liver prominent without focal lesion.  Minimal ventral hernia containing only fat.  Arthropathy in each sacroiliac joint, symmetric.   Electronically Signed   By: Bretta Bang III M.D.   On: 12/22/2014 07:05     EKG Interpretation None      MDM   Final diagnoses:  Nephrolithiasis  Nausea vomiting and diarrhea    Pt is a 24 y.o. female with Pmhx as above who presents with sudden onset of generalized crampy abdominal pain with radiation to left flank, nausea, several episodes of vomiting and several episodes of loose stool.  She is status post 1 month of a normal spontaneous vaginal delivery without complications.  She states that she has having very scant, light brown discharge that is been much lessened recently.  She denies fevers, chills, dysuria or hematuria.  No sick contacts, no recent antibiotic use.  No suspicious food intake.  On physical exam, patient appears uncomfortable.  She is generalized abdominal tenderness without rebound or guarding.  Cardiopulmonary exam is benign.  Patient is currently breast-feeding and hasn't really been instructed that she will need to "pump and dump", after IV medications that will be given in the emergency department today.   UA with small leukocytosis which is likely skin contaminate, small hgb, CBC nml CMP nml except for that is likely a reactive hyperglycemia and mild ALT elevation. Pt continues to have d/a in dept. And is complaining more prominently  of L flank pain.  Fentanyl ordered. Will get CT stone study, Dr Littie Deeds will follow result.     Toy Cookey, MD 12/23/14 [phone]

## 2014-12-22 NOTE — ED Notes (Signed)
Pt states had a vaginal delivery 4wks ago, woke up at 3a with terrible lower abdomen and lower back cramping/pain, states had 3 BM's since; states feels like contractions; denies vaginal bleeding; took ibuprofen with no reflief

## 2014-12-22 NOTE — ED Notes (Signed)
Pt reports feeling relief from abd cramping

## 2014-12-23 LAB — URINE CULTURE

## 2014-12-24 ENCOUNTER — Encounter (HOSPITAL_BASED_OUTPATIENT_CLINIC_OR_DEPARTMENT_OTHER): Payer: Self-pay

## 2014-12-24 ENCOUNTER — Emergency Department (HOSPITAL_BASED_OUTPATIENT_CLINIC_OR_DEPARTMENT_OTHER)
Admission: EM | Admit: 2014-12-24 | Discharge: 2014-12-24 | Disposition: A | Discharge status: Home / Self Care | Payer: Medicaid Other | Attending: Emergency Medicine | Admitting: Emergency Medicine

## 2014-12-24 DIAGNOSIS — O9089 Other complications of the puerperium, not elsewhere classified: Secondary | ICD-10-CM | POA: Diagnosis not present

## 2014-12-24 DIAGNOSIS — Z8659 Personal history of other mental and behavioral disorders: Secondary | ICD-10-CM | POA: Insufficient documentation

## 2014-12-24 DIAGNOSIS — R630 Anorexia: Secondary | ICD-10-CM | POA: Insufficient documentation

## 2014-12-24 DIAGNOSIS — Z872 Personal history of diseases of the skin and subcutaneous tissue: Secondary | ICD-10-CM | POA: Diagnosis not present

## 2014-12-24 DIAGNOSIS — R112 Nausea with vomiting, unspecified: Secondary | ICD-10-CM | POA: Diagnosis not present

## 2014-12-24 DIAGNOSIS — Z8639 Personal history of other endocrine, nutritional and metabolic disease: Secondary | ICD-10-CM | POA: Insufficient documentation

## 2014-12-24 DIAGNOSIS — Z793 Long term (current) use of hormonal contraceptives: Secondary | ICD-10-CM | POA: Diagnosis not present

## 2014-12-24 DIAGNOSIS — Z792 Long term (current) use of antibiotics: Secondary | ICD-10-CM | POA: Insufficient documentation

## 2014-12-24 DIAGNOSIS — Z3202 Encounter for pregnancy test, result negative: Secondary | ICD-10-CM | POA: Diagnosis not present

## 2014-12-24 DIAGNOSIS — Z760 Encounter for issue of repeat prescription: Secondary | ICD-10-CM | POA: Diagnosis present

## 2014-12-24 DIAGNOSIS — N23 Unspecified renal colic: Secondary | ICD-10-CM | POA: Diagnosis not present

## 2014-12-24 DIAGNOSIS — Z79899 Other long term (current) drug therapy: Secondary | ICD-10-CM | POA: Diagnosis not present

## 2014-12-24 DIAGNOSIS — Z87442 Personal history of urinary calculi: Secondary | ICD-10-CM | POA: Insufficient documentation

## 2014-12-24 DIAGNOSIS — Z87440 Personal history of urinary (tract) infections: Secondary | ICD-10-CM | POA: Insufficient documentation

## 2014-12-24 LAB — URINALYSIS, ROUTINE W REFLEX MICROSCOPIC
Bilirubin Urine: NEGATIVE
GLUCOSE, UA: NEGATIVE mg/dL
Hgb urine dipstick: NEGATIVE
Ketones, ur: 15 mg/dL — AB
Leukocytes, UA: NEGATIVE
Nitrite: NEGATIVE
Protein, ur: NEGATIVE mg/dL
Specific Gravity, Urine: 1.015 (ref 1.005–1.030)
Urobilinogen, UA: 0.2 mg/dL (ref 0.0–1.0)
pH: 6.5 (ref 5.0–8.0)

## 2014-12-24 LAB — PREGNANCY, URINE: Preg Test, Ur: NEGATIVE

## 2014-12-24 MED ORDER — IBUPROFEN 600 MG PO TABS: 600.0000 mg | ORAL_TABLET | Freq: Four times a day (QID) | ORAL | Status: AC

## 2014-12-24 MED ORDER — IBUPROFEN 800 MG PO TABS: 800.0000 mg | ORAL_TABLET | Freq: Three times a day (TID) | ORAL | Status: AC

## 2014-12-24 MED ORDER — OXYCODONE-ACETAMINOPHEN 5-325 MG PO TABS: 1.0000 | ORAL_TABLET | ORAL | Status: AC | PRN

## 2014-12-24 NOTE — ED Provider Notes (Signed)
CSN: [id number]     Arrival date & time 12/24/14  1824 History  This chart was scribed for Brandy Sou, MD by Brandy Patterson, ED Scribe. This patient was seen in room MH11/MH11 and the patient's care was started at 8:55 PM.    Chief Complaint  Patient presents with  . Medication Refill   The history is provided by the patient and medical records. No language interpreter was used.    HPI Comments: Brandy Patterson is a 24 y.o. female who presents to the Emergency Department for medication refill. She was diagnosed with kidney stones here 2 days ago after workup for abdominal cramping; CT scan revealed 4mm stone on left side and 1mm stone in left kidney. She was discharged with antibiotics for potential UTI, Percocet, and Zofran. She states she saw some specks in her urine yesterday, but states pain, nausea, and vomiting have continued. She also reports decreased volume of urination and decreased appetite. She states she vomited up a significant amount of her medication. She has an appointment scheduled with urologist but is unsure when. She notes she had a child 1 month ago. She is not breastfeeding because of the medication she is using.   Past Medical History  Diagnosis Date  . UTI (lower urinary tract infection)   . Bacterial vaginosis   . Rh negative status during pregnancy   . Anxiety   . Abnormal Pap smear   . Endocrinopathy   . Bartholin's gland abscess 09/2012    Right  side;  I & D with Word Catheter Placement   Past Surgical History  Procedure Laterality Date  . Hydradenitis excision  2010    groin  . Mouth surgery    . Wisdom tooth extraction     Family History  Problem Relation Age of Onset  . Diabetes Father   . Hypertension Father   . Thyroid disease Father   . Hypertension Maternal Grandmother   . Diabetes Maternal Grandmother   . Hypertension Maternal Grandfather   . Diabetes Maternal Grandfather   . Cancer Maternal Grandfather   . Anemia Neg Hx   . Migraines  Mother   . Aneurysm Mother 64    2006; cerebral aneurysm  . Seizures Sister     during childhood  . Syncope episode Sister     adolescence   History  Substance Use Topics  . Smoking status: Never Smoker   . Smokeless tobacco: Never Used  . Alcohol Use: No   OB History    Gravida Para Term Preterm AB TAB SAB Ectopic Multiple Living   0 2    0 2     Review of Systems  Constitutional: Positive for appetite change.  HENT: Negative.   Respiratory: Negative.   Cardiovascular: Negative.   Gastrointestinal: Positive for nausea, vomiting and abdominal pain.  Genitourinary: Positive for decreased urine volume.  Musculoskeletal: Negative.   Skin: Negative.   Neurological: Negative.   Psychiatric/Behavioral: Negative.       Allergies  Cephalexin; Doxycycline; Erythromycin; and Vicodin  Home Medications   Prior to Admission medications   Medication Sig Start Date End Date Taking? Authorizing Provider  cetirizine (ZYRTEC) 10 MG tablet Take 10 mg by mouth daily.    Historical Provider, MD  ciprofloxacin (CIPRO) 500 MG tablet Take 1 tablet (500 mg total) by mouth 2 (two) times daily. One po bid x 7 days 12/22/14   Mirian Mo, MD  ferrous sulfate (FERROUSUL) 325 (65 FE) MG tablet Take  1 tablet (325 mg total) by mouth daily with breakfast. 11/23/14   Venus Standard, CNM  ibuprofen (ADVIL,MOTRIN) 600 MG tablet Take 1 tablet (600 mg total) by mouth every 6 (six) hours. 11/23/14   Venus Standard, CNM  norethindrone (ORTHO MICRONOR) 0.35 MG tablet Take 1 tablet (0.35 mg total) by mouth daily. 11/23/14   Venus Standard, CNM  ondansetron (ZOFRAN ODT) 4 MG disintegrating tablet 4mg  ODT q4 hours prn nausea/vomit 12/22/14   Mirian MoMatthew Gentry, MD  oxyCODONE-acetaminophen (PERCOCET/ROXICET) 5-325 MG per tablet Take 1-2 tablets by mouth every 4 (four) hours as needed for severe pain. 12/22/14   Mirian MoMatthew Gentry, MD  Prenatal Vit-Fe Fumarate-FA (PRENATAL MULTIVITAMIN) TABS tablet Take 1 tablet by mouth  daily at 12 noon.    Historical Provider, MD   BP 122/80 mmHg  Pulse 97  Temp(Src) 98.7 F (37.1 C) (Oral)  Resp 18  Ht 5\' 6"  (1.676 m)  Wt 189 lb (85.73 kg)  BMI 30.52 kg/m2  SpO2 100% Physical Exam  Constitutional: She appears well-developed and well-nourished.  HENT:  Head: Normocephalic and atraumatic.  Eyes: Conjunctivae are normal. Pupils are equal, round, and reactive to light.  Neck: Neck supple. No tracheal deviation present. No thyromegaly present.  Cardiovascular: Normal rate and regular rhythm.   No murmur heard. Pulmonary/Chest: Effort normal and breath sounds normal.  Abdominal: Soft. Bowel sounds are normal. She exhibits no distension. There is no tenderness.  Musculoskeletal: Normal range of motion. She exhibits no edema or tenderness.  Neurological: She is alert. Coordination normal.  Skin: Skin is warm and dry. No rash noted.  Psychiatric: She has a normal mood and affect.  Nursing note and vitals reviewed.   ED Course  Procedures (including critical care time)  DIAGNOSTIC STUDIES: Oxygen Saturation is 100% on room air, normal by my interpretation.    COORDINATION OF CARE: 9:08 PM Discussed treatment plan with patient at beside, the patient agrees with the plan and has no further questions at this time.   Labs Review Labs Reviewed  URINALYSIS, ROUTINE W REFLEX MICROSCOPIC - Abnormal; Notable for the following:    Ketones, ur 15 (*)    All other components within normal limits  PREGNANCY, URINE    Imaging Review No results found.   EKG Interpretation None     Old chart and CT imaging and laboratory data reviewed. Results for orders placed or performed during the hospital encounter of 12/24/14  Urinalysis, Routine w reflex microscopic  Result Value Ref Range   Color, Urine YELLOW YELLOW   APPearance CLEAR CLEAR   Specific Gravity, Urine 1.015 1.005 - 1.030   pH 6.5 5.0 - 8.0   Glucose, UA NEGATIVE NEGATIVE mg/dL   Hgb urine dipstick  NEGATIVE NEGATIVE   Bilirubin Urine NEGATIVE NEGATIVE   Ketones, ur 15 (A) NEGATIVE mg/dL   Protein, ur NEGATIVE NEGATIVE mg/dL   Urobilinogen, UA 0.2 0.0 - 1.0 mg/dL   Nitrite NEGATIVE NEGATIVE   Leukocytes, UA NEGATIVE NEGATIVE  Pregnancy, urine  Result Value Ref Range   Preg Test, Ur NEGATIVE NEGATIVE   Ct Renal Stone Study  12/22/2014   CLINICAL DATA:  Lower abdomen and flank pain/cramping  EXAM: CT ABDOMEN AND PELVIS WITHOUT CONTRAST  TECHNIQUE: Multidetector CT imaging of the abdomen and pelvis was performed following the standard protocol without oral or intravenous contrast material administration.  COMPARISON:  None.  FINDINGS: Lung bases are clear.  Liver is prominent, measuring 20.6 cm in length. No focal liver lesions are identified on  this noncontrast enhanced study. Gallbladder wall is not appreciably thickened. There is no biliary duct dilatation.  Spleen, pancreas, and adrenals appear normal. Right kidney shows no evidence of mass or hydronephrosis on either side. There is no renal or ureteral calculus on the right. On the left, there is no renal mass. There is, however, moderate hydronephrosis on the left. There is a 1 mm nonobstructing calculus in the lower pole left kidney region. There is a calculus in the distal left ureter measuring 4 x 4 mm. No other ureteral calculus is identified. There are phleboliths in the pelvis as well.  In the pelvis, the urinary bladder is partially decompressed. The wall of the urinary bladder is not thickened. There is no pelvic mass or pelvic fluid collection. The appendix appears normal.  There is no bowel obstruction. There is no free air or portal venous air. There is a rather minimal ventral hernia containing only fat. There is no ascites, adenopathy, or abscess in the abdomen or pelvis. There is no abdominal aortic aneurysm. There are no blastic or lytic bone lesions. There is narrowing of each sacroiliac joint with sclerosis in the bone adjacent to  each sacroiliac joint.  IMPRESSION: 4 mm calculus distal left ureter with moderate hydronephrosis on the left. 1 mm nonobstructing calculus lower pole left kidney.  No bowel obstruction.  No abscess.  Appendix appears normal.  Liver prominent without focal lesion.  Minimal ventral hernia containing only fat.  Arthropathy in each sacroiliac joint, symmetric.   Electronically Signed   By: Bretta Bang III M.D.   On: 12/22/2014 07:05    MDM  Patient is 1 month postpartum. Currently not breast-feeding. She has 7 Percocet tablets left. She has been using them as prescribed. Plan prescription ibuprofen, Percocet. She can discontinue Cipro, no evidence of urinary tract infection she has scheduled follow-up point with urologist Diagnosis ureteral colic Final diagnoses:  None          Brandy Sou, MD 12/24/14 2118

## 2014-12-24 NOTE — ED Notes (Signed)
Pt here 3 days ago, dx with kidney stones.  Having vomiting and worsening pain, thinks she passed one stone.  Called to get refill on meds but instructed to come back in for script refills.  No new complaints.

## 2014-12-24 NOTE — Discharge Instructions (Signed)
Kidney Stones Discontinue ciprofloxacin. You don't need it any longer. You can take ondansetron (Zofran) 2 tablets or 8 mg 3 times daily as needed for nausea. Take the ibuprofen for mild to moderate pain or the oxycodone for severe pain. Keep your scheduled appointment with your urologist Kidney stones (urolithiasis) are solid masses that form inside your kidneys. The intense pain is caused by the stone moving through the kidney, ureter, bladder, and urethra (urinary tract). When the stone moves, the ureter starts to spasm around the stone. The stone is usually passed in your pee (urine).  HOME CARE  Drink enough fluids to keep your pee clear or pale yellow. This helps to get the stone out.  Strain all pee through the provided strainer. Do not pee without peeing through the strainer, not even once. If you pee the stone out, catch it in the strainer. The stone may be as small as a grain of salt. Take this to your doctor. This will help your doctor figure out what you can do to try to prevent more kidney stones.  Only take medicine as told by your doctor.  Follow up with your doctor as told.  Get follow-up X-rays as told by your doctor. GET HELP IF: You have pain that gets worse even if you have been taking pain medicine. GET HELP RIGHT AWAY IF:   Your pain does not get better with medicine.  You have a fever or shaking chills.  Your pain increases and gets worse over 18 hours.  You have new belly (abdominal) pain.  You feel faint or pass out.  You are unable to pee. MAKE SURE YOU:   Understand these instructions.  Will watch your condition.  Will get help right away if you are not doing well or get worse. Document Released: 02/25/2008 Document Revised: 05/11/2013 Document Reviewed: 02/09/2013 [facility] LLPExitCare Patient Information 2015 [employer], [place]. This information is not intended to replace advice given to you by your health care provider. Make sure you discuss any questions you have  with your health care provider.

## 2015-04-21 IMAGING — CR DG ABDOMEN 1V
1 series · 1 of 1 positions shown · non-contrast
Comparison: None.

CLINICAL DATA: Abdominal pain for the past week.

EXAM:
ABDOMEN - 1 VIEW

[AP]
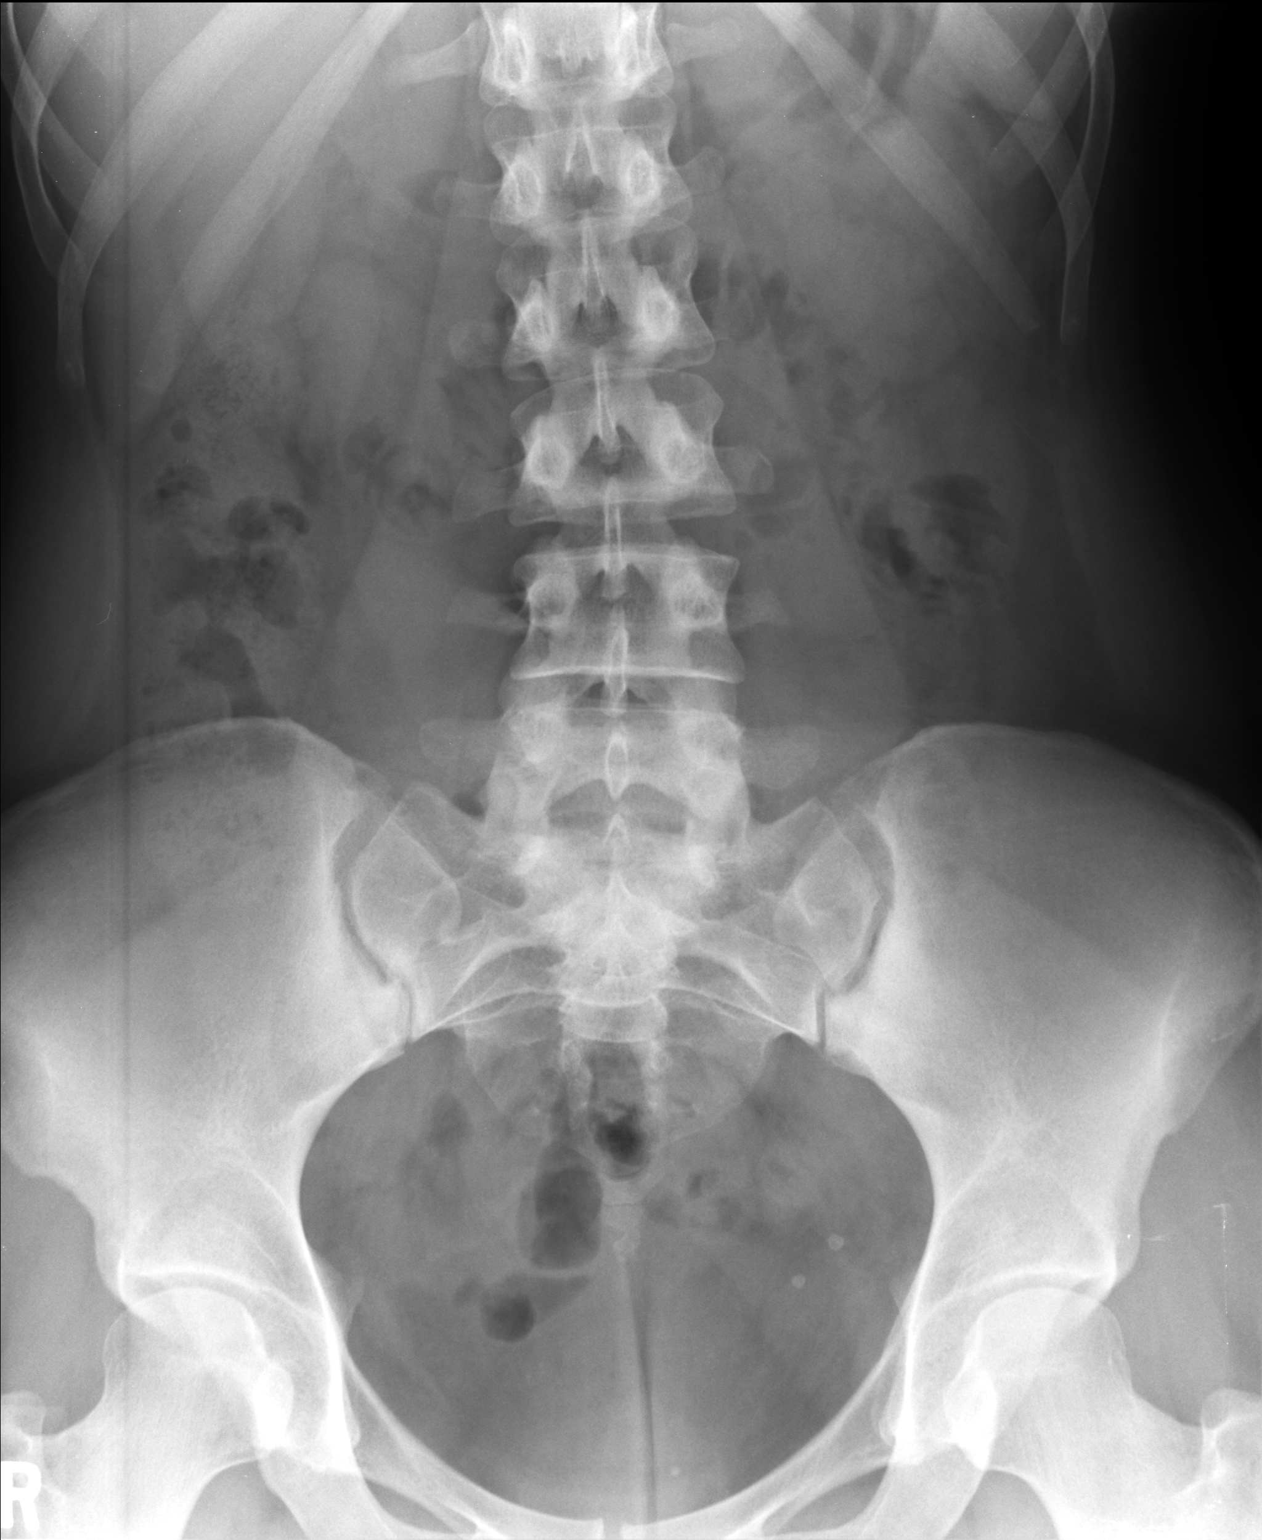

[1 of 1 positions shown; findings below may reference images not displayed]

FINDINGS: Normal bowel gas pattern. Mild levoconvex lumbar scoliosis. Left
pelvic phleboliths.
IMPRESSION: No acute abnormality.

## 2016-08-14 IMAGING — CT CT RENAL STONE PROTOCOL
2 of 4 series · 15 of 46 positions shown, 17 images · non-contrast
Comparison: None.

CLINICAL DATA: Lower abdomen and flank pain/cramping

EXAM:
CT ABDOMEN AND PELVIS WITHOUT CONTRAST
TECHNIQUE: Multidetector CT imaging of the abdomen and pelvis was performed
following the standard protocol without oral or intravenous contrast
material administration.

[Series 2: renal stone < 200 lbs 5.0 b31f · axial · 0.86mm/px · z∈[-531,-91]mm · 12 of 101 slices shown, 14 images]
[im 9/101  soft-tissue]
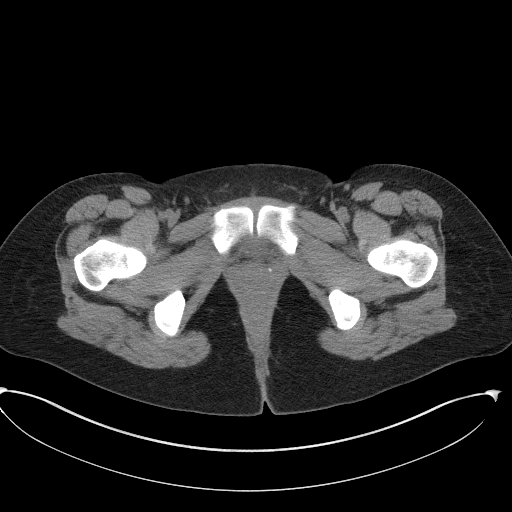
[im 9/101  bone]
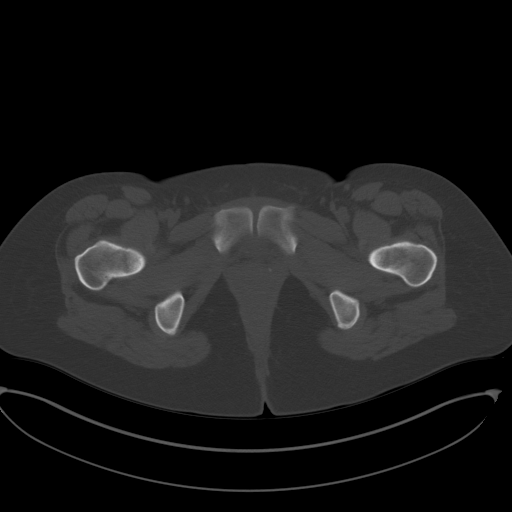
[im 17/101  soft-tissue]
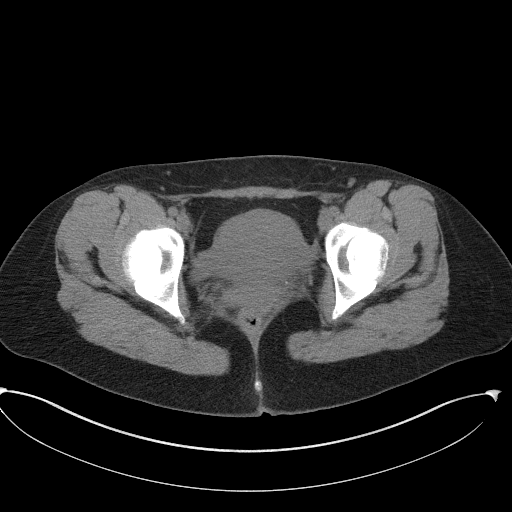
[im 25/101  soft-tissue]
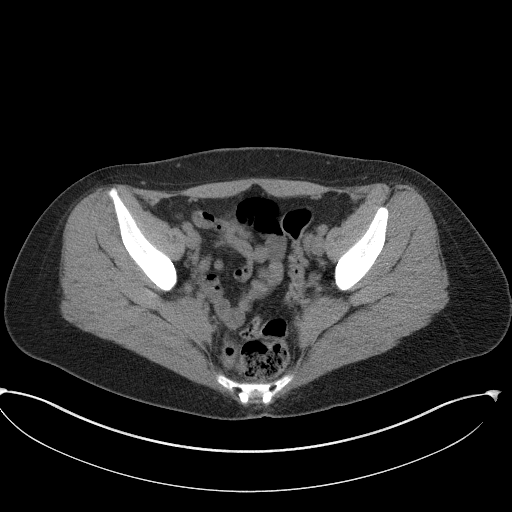
[im 33/101  soft-tissue]
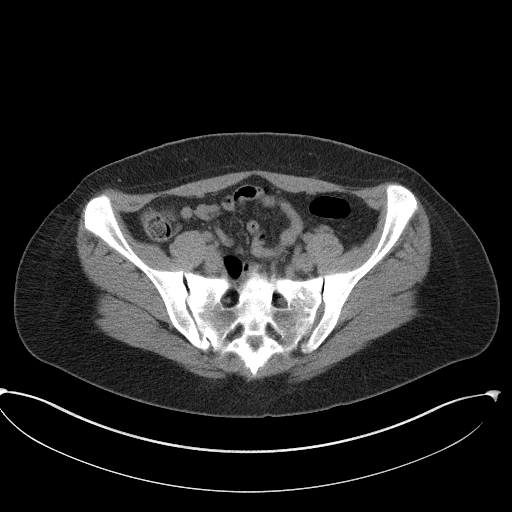
[im 41/101  soft-tissue]
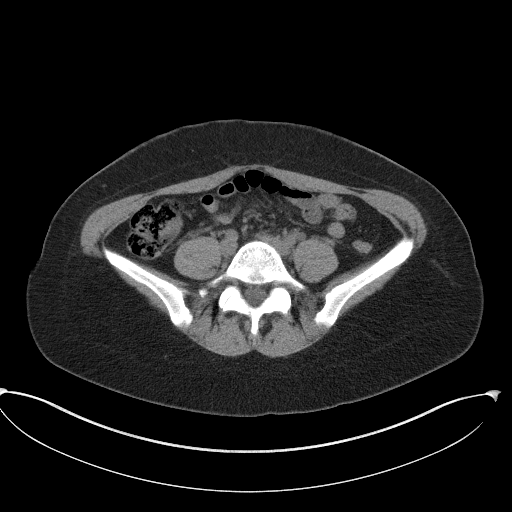
[im 49/101  soft-tissue]
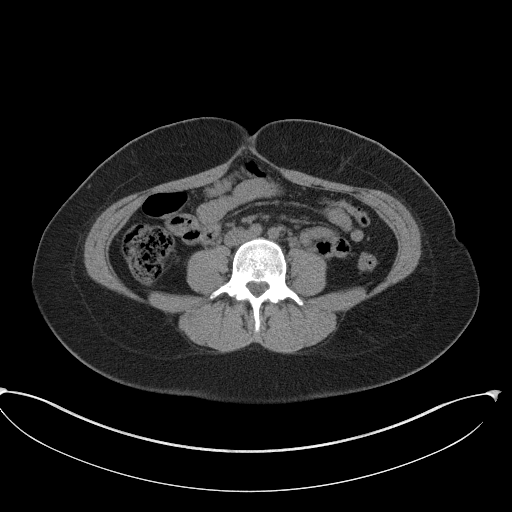
[im 57/101  soft-tissue]
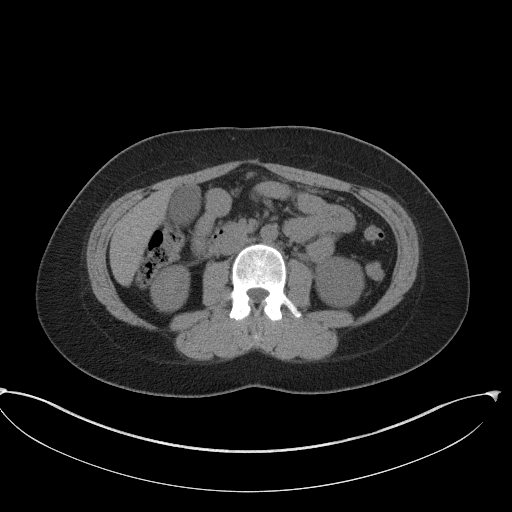
[im 65/101  soft-tissue]
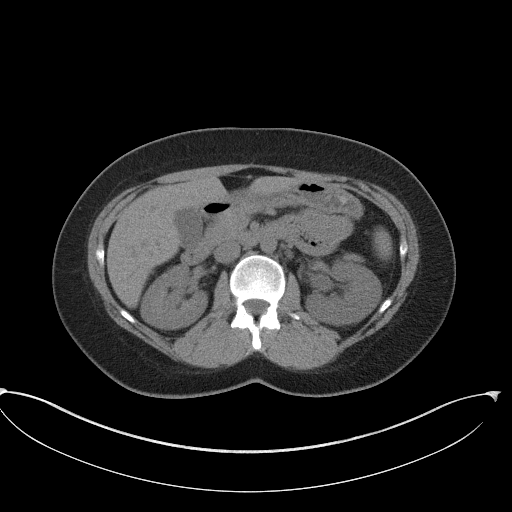
[im 73/101  soft-tissue]
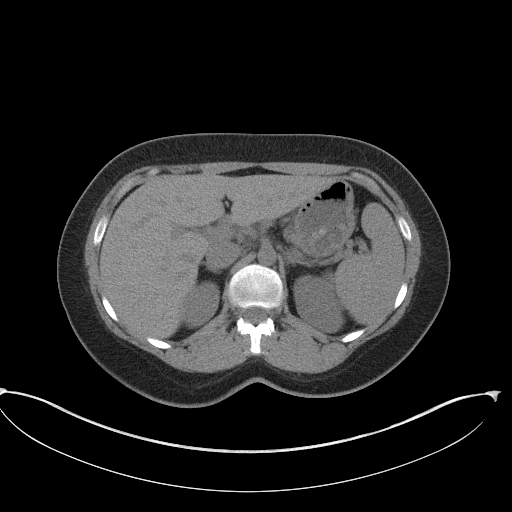
[im 73/101  bone]
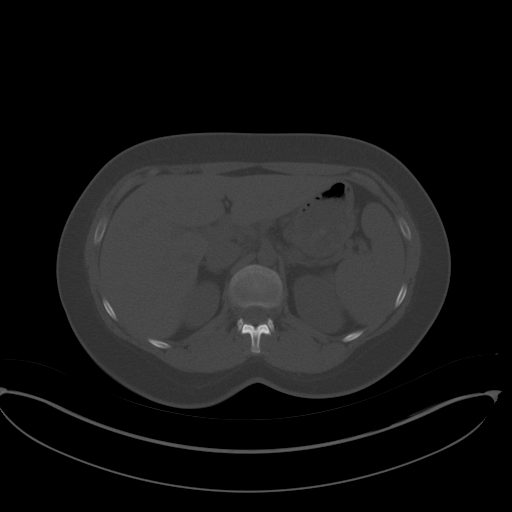
[im 81/101  soft-tissue]
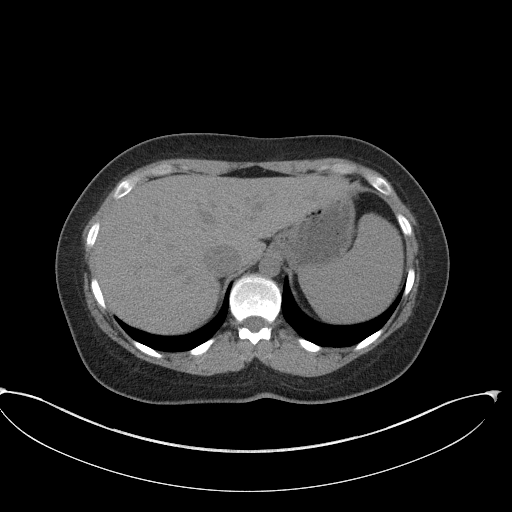
[im 89/101  soft-tissue]
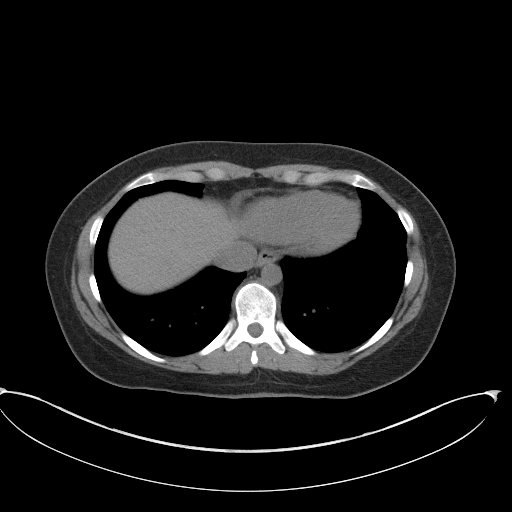
[im 97/101  soft-tissue]
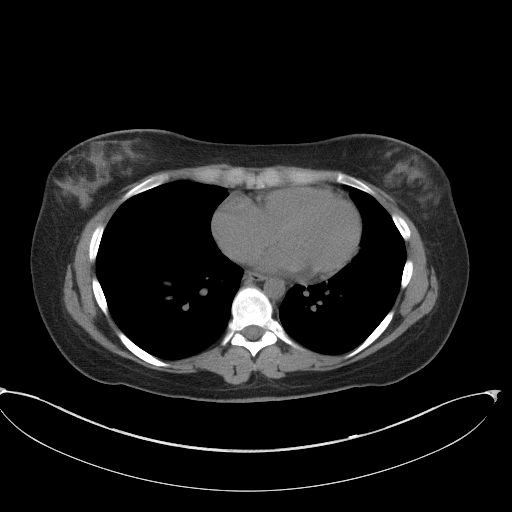

[Series 5: renal stone 3.0 coronal · coronal · 0.72mm/px · 3 of 75 slices shown]
[im 25/75  soft-tissue]
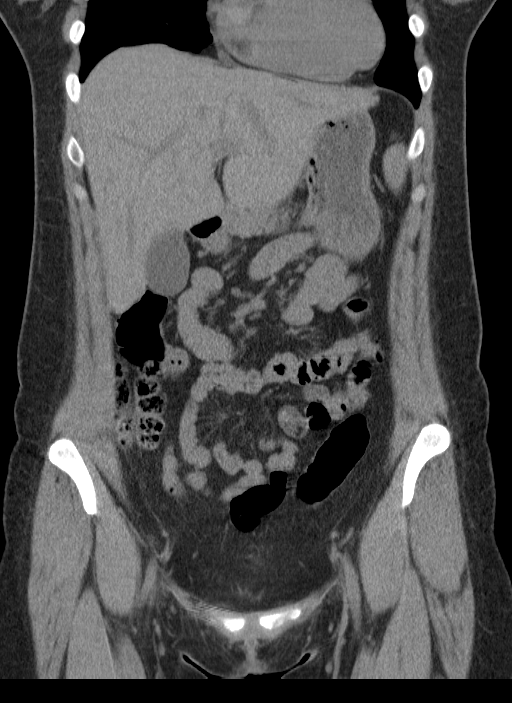
[im 33/75  soft-tissue]
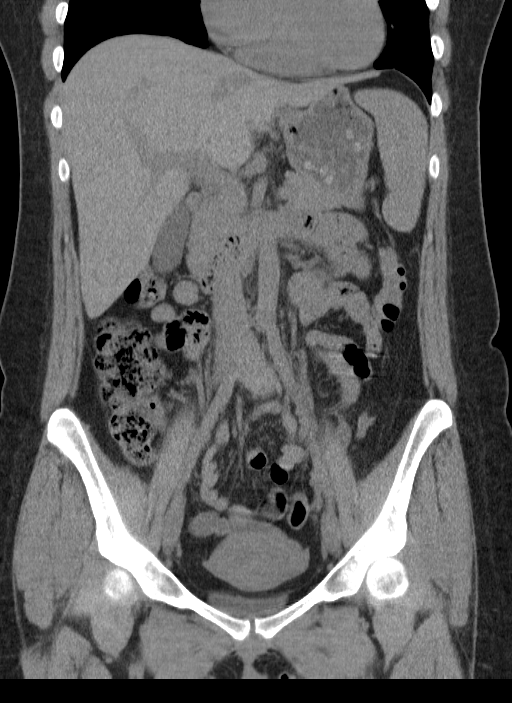
[im 42/75  soft-tissue]
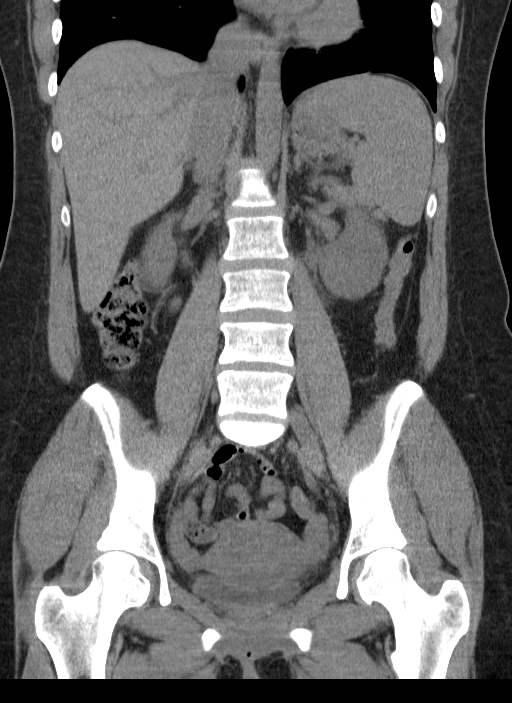

[15 of 46 positions shown; findings below may reference images not displayed]

FINDINGS: Lung bases are clear.

Liver is prominent, measuring 20.6 cm in length. No focal liver
lesions are identified on this noncontrast enhanced study.
Gallbladder wall is not appreciably thickened. There is no biliary
duct dilatation.

Spleen, pancreas, and adrenals appear normal. Right kidney shows no
evidence of mass or hydronephrosis on either side. There is no renal
or ureteral calculus on the right. On the left, there is no renal
mass. There is, however, moderate hydronephrosis on the left. There
is a 1 mm nonobstructing calculus in the lower pole left kidney
region. There is a calculus in the distal left ureter measuring 4 x
4 mm. No other ureteral calculus is identified. There are
phleboliths in the pelvis as well.

In the pelvis, the urinary bladder is partially decompressed. The
wall of the urinary bladder is not thickened. There is no pelvic
mass or pelvic fluid collection. The appendix appears normal.

There is no bowel obstruction. There is no free air or portal venous
air. There is a rather minimal ventral hernia containing only fat.
There is no ascites, adenopathy, or abscess in the abdomen or
pelvis. There is no abdominal aortic aneurysm. There are no blastic
or lytic bone lesions. There is narrowing of each sacroiliac joint
with sclerosis in the bone adjacent to each sacroiliac joint.
IMPRESSION: 4 mm calculus distal left ureter with moderate hydronephrosis on the
left. 1 mm nonobstructing calculus lower pole left kidney.

No bowel obstruction.  No abscess.  Appendix appears normal.

Liver prominent without focal lesion.

Minimal ventral hernia containing only fat.

Arthropathy in each sacroiliac joint, symmetric.

## 2024-10-25 ENCOUNTER — Ambulatory Visit: Payer: Self-pay | Admitting: Allergy & Immunology

## 2024-10-27 ENCOUNTER — Ambulatory Visit: Payer: Self-pay | Admitting: Allergy

## 2024-11-10 ENCOUNTER — Ambulatory Visit: Payer: Self-pay | Admitting: Allergy
